# Patient Record
Sex: Female | Born: 1970 | Race: White | Hispanic: No | Smoking: Never smoker
Health system: Southern US, Community
[De-identification: ages and names within clinical notes are randomized; demographics above are authoritative.]

## PROBLEM LIST (undated history)

## (undated) DIAGNOSIS — E282 Polycystic ovarian syndrome: Secondary | ICD-10-CM

## (undated) DIAGNOSIS — G4730 Sleep apnea, unspecified: Secondary | ICD-10-CM

## (undated) DIAGNOSIS — F32A Depression, unspecified: Secondary | ICD-10-CM

## (undated) DIAGNOSIS — T8859XA Other complications of anesthesia, initial encounter: Secondary | ICD-10-CM

## (undated) DIAGNOSIS — N809 Endometriosis, unspecified: Secondary | ICD-10-CM

## (undated) DIAGNOSIS — F419 Anxiety disorder, unspecified: Secondary | ICD-10-CM

## (undated) DIAGNOSIS — J45909 Unspecified asthma, uncomplicated: Secondary | ICD-10-CM

## (undated) DIAGNOSIS — B2790 Infectious mononucleosis, unspecified without complication: Secondary | ICD-10-CM

## (undated) DIAGNOSIS — D649 Anemia, unspecified: Secondary | ICD-10-CM

## (undated) DIAGNOSIS — F99 Mental disorder, not otherwise specified: Secondary | ICD-10-CM

## (undated) HISTORY — PX: CHOLECYSTECTOMY: SHX55

## (undated) HISTORY — DX: Anxiety disorder, unspecified: F41.9

## (undated) HISTORY — PX: ANTERIOR CRUCIATE LIGAMENT REPAIR: SHX115

## (undated) HISTORY — DX: Polycystic ovarian syndrome: E28.2

## (undated) HISTORY — PX: LAPAROSCOPY: SHX197

## (undated) HISTORY — DX: Mental disorder, not otherwise specified: F99

## (undated) HISTORY — DX: Infectious mononucleosis, unspecified without complication: B27.90

---

## 2004-07-02 DIAGNOSIS — K589 Irritable bowel syndrome without diarrhea: Secondary | ICD-10-CM | POA: Insufficient documentation

## 2018-11-09 LAB — HM COLONOSCOPY

## 2020-06-28 ENCOUNTER — Other Ambulatory Visit: Payer: Self-pay

## 2020-06-28 ENCOUNTER — Telehealth: Payer: Self-pay | Admitting: General Practice

## 2020-06-28 ENCOUNTER — Other Ambulatory Visit (HOSPITAL_COMMUNITY)
Admission: RE | Admit: 2020-06-28 | Discharge: 2020-06-28 | Disposition: A | Discharge status: Home / Self Care | Payer: 59 | Source: Ambulatory Visit | Attending: Family Medicine | Admitting: Family Medicine

## 2020-06-28 ENCOUNTER — Ambulatory Visit (INDEPENDENT_AMBULATORY_CARE_PROVIDER_SITE_OTHER): Payer: 59 | Admitting: Family Medicine

## 2020-06-28 ENCOUNTER — Encounter: Payer: Self-pay | Admitting: Family Medicine

## 2020-06-28 VITALS — BP 155/86 | HR 87 | Ht 66.0 in | Wt 262.6 lb

## 2020-06-28 DIAGNOSIS — N809 Endometriosis, unspecified: Secondary | ICD-10-CM

## 2020-06-28 DIAGNOSIS — F3341 Major depressive disorder, recurrent, in partial remission: Secondary | ICD-10-CM

## 2020-06-28 DIAGNOSIS — R030 Elevated blood-pressure reading, without diagnosis of hypertension: Secondary | ICD-10-CM

## 2020-06-28 DIAGNOSIS — G2581 Restless legs syndrome: Secondary | ICD-10-CM | POA: Diagnosis not present

## 2020-06-28 DIAGNOSIS — Z23 Encounter for immunization: Secondary | ICD-10-CM | POA: Diagnosis not present

## 2020-06-28 DIAGNOSIS — N200 Calculus of kidney: Secondary | ICD-10-CM | POA: Insufficient documentation

## 2020-06-28 DIAGNOSIS — Z124 Encounter for screening for malignant neoplasm of cervix: Secondary | ICD-10-CM

## 2020-06-28 DIAGNOSIS — Z01419 Encounter for gynecological examination (general) (routine) without abnormal findings: Secondary | ICD-10-CM | POA: Diagnosis present

## 2020-06-28 DIAGNOSIS — F3342 Major depressive disorder, recurrent, in full remission: Secondary | ICD-10-CM | POA: Insufficient documentation

## 2020-06-28 MED ORDER — BUPROPION HCL ER (XL) 150 MG PO TB24: 300.0000 mg | ORAL_TABLET | Freq: Every day | ORAL | 3 refills | Status: DC

## 2020-06-28 MED ORDER — FLUOXETINE HCL 40 MG PO CAPS: 80.0000 mg | ORAL_CAPSULE | Freq: Every day | ORAL | 3 refills | Status: DC

## 2020-06-28 MED ORDER — ROPINIROLE HCL 0.5 MG PO TABS: 0.5000 mg | ORAL_TABLET | Freq: Every day | ORAL | 3 refills | Status: DC

## 2020-06-28 NOTE — Assessment & Plan Note (Signed)
Given age, rising BP, will trial off OCs. If in menopause and no pain, no further treatment, if gets pain return, then can try Aygestin. A Progestin only. Would not do Orilissa due to depression symptoms.

## 2020-06-28 NOTE — Progress Notes (Signed)
Pt. Wants to discuss BC options. Previously taking Loestrin.  Last Pap: At least 6 years    Rechecked Pt. Blood pressure. First reading: 156/92, Second reading: 155/86. Instructed pt. To establish care with PCP and have this evaluated. Pt. Voiced understanding.

## 2020-06-28 NOTE — Telephone Encounter (Signed)
Called patient and left voicemail for her to callback to get established as a new patient with a provider.

## 2020-06-28 NOTE — Assessment & Plan Note (Signed)
Refilled meds. Usually on mood stabilizer--feels ok--will get psychiatry referral.

## 2020-06-28 NOTE — Patient Instructions (Signed)

## 2020-06-28 NOTE — Progress Notes (Signed)
Subjective:     Maria Clark is a 49 y.o. adult and is here for a comprehensive physical exam. The patient reports problems - on OC's for endometriosis. No cycles last few times. Moved here from [place]. 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 Mom went through menopause at age 30. She wonder if she might be too and is worried about mood if she is. Has been on OC's for a long time. Reports BP is ok at home. 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 Had laparoscopic proven endometriosis with significant painful periods.   The following portions of the patient's history were reviewed and updated as appropriate: allergies, current medications, past family history, past medical history, past social history, past surgical history and problem list.  Review of Systems Pertinent items noted in HPI and remainder of comprehensive ROS otherwise negative.   Objective:    BP (!) 155/86    Pulse 87    Ht 5\' 6"  (1.676 m)    Wt 262 lb 9.6 oz (119.1 kg)    BMI 42.38 kg/m  General appearance: alert, cooperative and appears stated age Head: Normocephalic, without obvious abnormality, atraumatic Neck: no adenopathy, supple, symmetrical, trachea midline and thyroid not enlarged, symmetric, no tenderness/mass/nodules Lungs: clear to auscultation bilaterally Breasts: normal appearance, no masses or tenderness Heart: regular rate and rhythm, S1, S2 normal, no murmur, click, rub or gallop Abdomen: soft, non-tender; bowel sounds normal; no masses,  no organomegaly Pelvic: cervix normal in appearance, external genitalia normal, no adnexal masses or tenderness, no cervical motion tenderness, uterus normal size, shape, and consistency and vagina normal without discharge Extremities: extremities normal, atraumatic, no cyanosis or edema Pulses: 2+ and symmetric Skin: Skin color, texture, turgor normal. No rashes or  lesions Lymph nodes: Cervical, supraclavicular, and axillary nodes normal. Neurologic: Grossly normal    Assessment:    Healthy adult exam.      Plan:   Problem List Items Addressed This Visit      Unprioritized   Endometriosis    Given age, rising BP, will trial off OCs. If in menopause and no pain, no further treatment, if gets pain return, then can try Aygestin. A Progestin only. Would not do Orilissa due to depression symptoms.      Depression    Refilled meds. Usually on mood stabilizer--feels ok--will get psychiatry referral.      Relevant Medications   buPROPion (WELLBUTRIN XL) 150 MG 24 hr tablet   FLUoxetine (PROZAC) 40 MG capsule   Other Relevant Orders   Ambulatory referral to Psychiatry    Other Visit Diagnoses    Restless leg    -  Primary   Relevant Medications   rOPINIRole (REQUIP) 0.5 MG tablet   Screening for malignant neoplasm of cervix       Encounter for gynecological examination without abnormal finding       Labs today   Relevant Orders   Cytology - PAP( [place])   MM DIGITAL SCREENING BILATERAL   Tdap vaccine greater than or equal to 7yo IM (Completed)   CBC   Hemoglobin A1c   Comprehensive metabolic panel   TSH   VITAMIN D 25 Hydroxy (Vit-D Deficiency, Fractures)   Lipid panel   Elevated BP without diagnosis of hypertension       Keep eye on BP at home.     Return in 3 months (on 09/28/2020).    See After  Visit Summary for Counseling Recommendations

## 2020-06-29 LAB — COMPREHENSIVE METABOLIC PANEL
ALT: 28 IU/L (ref 0–32)
AST: 16 IU/L (ref 0–40)
Albumin/Globulin Ratio: 1.4 (ref 1.2–2.2)
Albumin: 4.4 g/dL (ref 3.8–4.8)
Alkaline Phosphatase: 166 IU/L — ABNORMAL HIGH (ref 48–121)
BUN/Creatinine Ratio: 25 — ABNORMAL HIGH (ref 9–23)
BUN: 15 mg/dL (ref 6–24)
Bilirubin Total: 0.2 mg/dL (ref 0.0–1.2)
CO2: 25 mmol/L (ref 20–29)
Calcium: 9.4 mg/dL (ref 8.7–10.2)
Chloride: 98 mmol/L (ref 96–106)
Creatinine, Ser: 0.61 mg/dL (ref 0.57–1.00)
GFR calc Af Amer: 125 mL/min/{1.73_m2} (ref >59)
GFR calc non Af Amer: 108 mL/min/{1.73_m2} (ref >59)
Globulin, Total: 3.2 g/dL (ref 1.5–4.5)
Glucose: 105 mg/dL — ABNORMAL HIGH (ref 65–99)
Potassium: 4 mmol/L (ref 3.5–5.2)
Sodium: 140 mmol/L (ref 134–144)
Total Protein: 7.6 g/dL (ref 6.0–8.5)

## 2020-06-29 LAB — CBC
Hematocrit: 41.1 % (ref 34.0–46.6)
Hemoglobin: 13.7 g/dL (ref 11.1–15.9)
MCH: 29.1 pg (ref 26.6–33.0)
MCHC: 33.3 g/dL (ref 31.5–35.7)
MCV: 87 fL (ref 79–97)
Platelets: 383 10*3/uL (ref 150–450)
RBC: 4.71 x10E6/uL (ref 3.77–5.28)
RDW: 13 % (ref 11.7–15.4)
WBC: 8.5 10*3/uL (ref 3.4–10.8)

## 2020-06-29 LAB — TSH: TSH: 1.45 u[IU]/mL (ref 0.450–4.500)

## 2020-06-29 LAB — VITAMIN D 25 HYDROXY (VIT D DEFICIENCY, FRACTURES): Vit D, 25-Hydroxy: 54.3 ng/mL (ref 30.0–100.0)

## 2020-06-29 LAB — HEMOGLOBIN A1C
Est. average glucose Bld gHb Est-mCnc: 103 mg/dL
Hgb A1c MFr Bld: 5.2 % (ref 4.8–5.6)

## 2020-06-29 LAB — LIPID PANEL
Chol/HDL Ratio: 4.2 ratio (ref 0.0–4.4)
Cholesterol, Total: 162 mg/dL (ref 100–199)
HDL: 39 mg/dL — ABNORMAL LOW (ref >39)
LDL Chol Calc (NIH): 56 mg/dL (ref 0–99)
Triglycerides: 449 mg/dL — ABNORMAL HIGH (ref 0–149)
VLDL Cholesterol Cal: 67 mg/dL — ABNORMAL HIGH (ref 5–40)

## 2020-07-03 ENCOUNTER — Telehealth: Payer: Self-pay | Admitting: Radiology

## 2020-07-03 LAB — CYTOLOGY - PAP
Comment: NEGATIVE
Diagnosis: NEGATIVE
High risk HPV: NEGATIVE

## 2020-07-03 NOTE — Telephone Encounter (Signed)
Called and left voicemail for patient to schedule repeat Lab in 2 months per Dr Kennon Rounds request.

## 2020-07-04 ENCOUNTER — Telehealth: Payer: Self-pay | Admitting: General Practice

## 2020-07-04 NOTE — Telephone Encounter (Signed)
Patient called office and got set up with a new patient appointment with Maria Clark. New patient paperwork has been mailed.

## 2020-08-17 ENCOUNTER — Ambulatory Visit
Admission: RE | Admit: 2020-08-17 | Discharge: 2020-08-17 | Disposition: A | Discharge status: Home / Self Care | Payer: 59 | Source: Ambulatory Visit | Attending: Family Medicine | Admitting: Family Medicine

## 2020-08-17 ENCOUNTER — Telehealth: Payer: Self-pay | Admitting: Radiology

## 2020-08-17 ENCOUNTER — Other Ambulatory Visit: Payer: Self-pay

## 2020-08-17 DIAGNOSIS — Z01419 Encounter for gynecological examination (general) (routine) without abnormal findings: Secondary | ICD-10-CM

## 2020-08-17 NOTE — Telephone Encounter (Signed)
Spoke with patient and explained that I have called [facility] and left a message for them to call [facility] so that I may refer patient. Will follow up tomorrow if they have not called back today. Also gave patient walk-in information for [facility] including address, all this information was also sent to patient via mychart message. Patient expresses understanding and will be awaiting follow up call from myself.

## 2020-08-29 ENCOUNTER — Telehealth: Payer: Self-pay | Admitting: Radiology

## 2020-08-29 NOTE — Telephone Encounter (Signed)
Called patient to follow up regarding referral made to [facility]. Received faxed from [facility] stating that they has left a message for patient to call to schedule appointment. Patient stated that she did get an appointment but not until 11/02/20 @ 9:00. Stated that she was concerned she may run out of meds before appointment. I explained that she can contact the office if she runs out and we can request a refill to be sent for enough medication to get her to appointment date. Patient expressed understand.

## 2020-09-11 ENCOUNTER — Encounter: Payer: Self-pay | Admitting: Family Medicine

## 2020-09-11 ENCOUNTER — Other Ambulatory Visit: Payer: 59

## 2020-09-11 ENCOUNTER — Other Ambulatory Visit: Payer: Self-pay | Admitting: *Deleted

## 2020-09-11 ENCOUNTER — Other Ambulatory Visit: Payer: Self-pay

## 2020-09-11 ENCOUNTER — Ambulatory Visit: Payer: 59 | Admitting: Family Medicine

## 2020-09-11 VITALS — BP 132/90 | HR 72 | Temp 97.6°F | Ht 66.0 in | Wt 270.2 lb

## 2020-09-11 DIAGNOSIS — G2581 Restless legs syndrome: Secondary | ICD-10-CM | POA: Diagnosis not present

## 2020-09-11 DIAGNOSIS — G4733 Obstructive sleep apnea (adult) (pediatric): Secondary | ICD-10-CM

## 2020-09-11 DIAGNOSIS — R519 Headache, unspecified: Secondary | ICD-10-CM

## 2020-09-11 DIAGNOSIS — I10 Essential (primary) hypertension: Secondary | ICD-10-CM

## 2020-09-11 DIAGNOSIS — Z23 Encounter for immunization: Secondary | ICD-10-CM

## 2020-09-11 DIAGNOSIS — J4520 Mild intermittent asthma, uncomplicated: Secondary | ICD-10-CM

## 2020-09-11 DIAGNOSIS — J45909 Unspecified asthma, uncomplicated: Secondary | ICD-10-CM | POA: Insufficient documentation

## 2020-09-11 DIAGNOSIS — F3342 Major depressive disorder, recurrent, in full remission: Secondary | ICD-10-CM

## 2020-09-11 DIAGNOSIS — G8929 Other chronic pain: Secondary | ICD-10-CM

## 2020-09-11 MED ORDER — MONTELUKAST SODIUM 10 MG PO TABS: 10.0000 mg | ORAL_TABLET | Freq: Every day | ORAL | 3 refills | Status: DC

## 2020-09-11 MED ORDER — NORETHINDRONE ACETATE 5 MG PO TABS: 5.0000 mg | ORAL_TABLET | Freq: Every day | ORAL | 10 refills | Status: DC

## 2020-09-11 NOTE — Assessment & Plan Note (Signed)
Etiology - ?HTN vs tension. Will focus on sleep apnea re-evaluation and life style changes. Cont tension treatment approach too.

## 2020-09-11 NOTE — Assessment & Plan Note (Signed)
Diagnosed several years ago and pt with weight gain since then. Advised pulm referral for establishing care here and re-evaluation. Cont using current machine

## 2020-09-11 NOTE — Patient Instructions (Signed)
For Gas avoid: Patients should be advised to avoid gas-producing foods (eg, beans, cabbage, onions, broccoli, brussel sprouts, wheat, and potatoes) and note if symptoms improve.  Heartburn - avoid laying down after you eat - avoid carbonated beverages - avoid spicy foods, coffee, chocolate - Could try Omeprazole 20 mg daily for up to 6-8 weeks   Your blood pressure high.   High blood pressure increases your risk for heart attack and stroke.    Please check your blood pressure 2-4 times a week.   To check your blood pressure 1) Sit in a quiet and relaxed place for 5 minutes 2) Make sure your feet are flat on the ground 3) Consider checking first thing in the morning   Normal blood pressure is less than 140/90 Ideally you blood pressure should be around 120/80  Other ways you can reduce your blood pressure:  1) Regular exercise -- Try to get 150 minutes (30 minutes, 5 days a week) of moderate to vigorous aerobic excercise -- Examples: brisk walking (2.5 miles per hour), water aerobics, dancing, gardening, tennis, biking slower than 10 miles per hour 2) DASH Diet - low fat meats, more fresh fruits and vegetables, whole grains, low salt 3) Quit smoking if you smoke 4) Loose 5-10% of your body weight

## 2020-09-11 NOTE — Assessment & Plan Note (Signed)
Cont ropinirole

## 2020-09-11 NOTE — Assessment & Plan Note (Signed)
BP elevated and reports high readings at home. Advised home monitoring - suspect with weight gain may be related to sleep apnea. Encouraged exercise and diet changes. Return 3 months for bp check

## 2020-09-11 NOTE — Assessment & Plan Note (Signed)
Stable currently. Has psych in November. Reports hx of admissions and mood stabilizers. Agree with psych consult but discussed if stable I could continue wellbutrin and fluoxetine

## 2020-09-11 NOTE — Progress Notes (Signed)
Subjective:     Maria Clark is a 49 y.o. female presenting for Establish Care (needs new bipap machine ), Gas (with bloating ), and Heartburn (x 2 weeks )     HPI  #HA - none in the last 2 weeks but were bad recently - hx of tension HA - adjusted computer at work  #Heartburn - over the last few weeks - gas symptoms - for months - BM - regular, no straining, occ diarrhea - has not found triggers for the gas - started using Charcoal pills for the gas - occ carbonated beverage - has tried gas-x w/o improvement  #HTN - bp occasionally elevated at home w/ headaches - will also get massage for HA - no chest pain - does have some breathing issues   Review of Systems   Social History   Tobacco Use  Smoking Status Never Smoker  Smokeless Tobacco Never Used        Objective:    BP Readings from Last 3 Encounters:  09/11/20 132/90  06/28/20 (!) 155/86   Wt Readings from Last 3 Encounters:  09/11/20 270 lb 4 oz (122.6 kg)  06/28/20 262 lb 9.6 oz (119.1 kg)    BP 132/90    Pulse 72    Temp 97.6 F (36.4 C) (Temporal)    Ht 5\' 6"  (1.676 m)    Wt 270 lb 4 oz (122.6 kg)    LMP 09/10/2020 (Exact Date) Comment: Patient stopped oral BC 1 month ago   SpO2 98%    BMI 43.62 kg/m    Physical Exam Constitutional:      General: She is not in acute distress.    Appearance: She is well-developed. She is obese. She is not diaphoretic.  HENT:     Right Ear: External ear normal.     Left Ear: External ear normal.  Eyes:     Conjunctiva/sclera: Conjunctivae normal.  Cardiovascular:     Rate and Rhythm: Normal rate and regular rhythm.     Heart sounds: No murmur heard.   Pulmonary:     Effort: Pulmonary effort is normal. No respiratory distress.     Breath sounds: Normal breath sounds. No wheezing or rales.  Musculoskeletal:     Cervical back: Neck supple.  Skin:    General: Skin is warm and dry.     Capillary Refill: Capillary refill takes less than 2  seconds.  Neurological:     Mental Status: She is alert. Mental status is at baseline.  Psychiatric:        Mood and Affect: Mood normal.        Behavior: Behavior normal.      The 10-year ASCVD risk score Mikey Bussing DC Jr., et al., 2013) is: 1.2%   Values used to calculate the score:     Age: 53 years     Sex: Female     Is Non-Hispanic African American: No     Diabetic: No     Tobacco smoker: No     Systolic Blood Pressure: 010 mmHg     Is BP treated: No     HDL Cholesterol: 39 mg/dL     Total Cholesterol: 162 mg/dL      Assessment & Plan:   Problem List Items Addressed This Visit      Cardiovascular and Mediastinum   Essential hypertension    BP elevated and reports high readings at home. Advised home monitoring - suspect with weight gain may be related  to sleep apnea. Encouraged exercise and diet changes. Return 3 months for bp check        Respiratory   Asthma - Primary    Stable. Cont singulair and prn albuterol. Notify if allergies and symptoms worsening and can consider qvar again      Relevant Medications   montelukast (SINGULAIR) 10 MG tablet   OSA treated with BiPAP    Diagnosed several years ago and pt with weight gain since then. Advised pulm referral for establishing care here and re-evaluation. Cont using current machine      Relevant Orders   Ambulatory referral to Pulmonology     Other   Major depressive disorder, recurrent episode, in full remission ([place])    Stable currently. Has psych in November. Reports hx of admissions and mood stabilizers. Agree with psych consult but discussed if stable I could continue wellbutrin and fluoxetine      Chronic headaches    Etiology - ?HTN vs tension. Will focus on sleep apnea re-evaluation and life style changes. Cont tension treatment approach too.       Restless legs    Cont ropinirole.        Other Visit Diagnoses    Need for influenza vaccination       Relevant Orders   Flu Vaccine QUAD 36+ mos IM  (Completed)       Return in about 3 months (around 12/11/2020) for BP check.  Lesleigh Noe, MD  This visit occurred during the SARS-CoV-2 public health emergency.  Safety protocols were in place, including screening questions prior to the visit, additional usage of staff PPE, and extensive cleaning of exam room while observing appropriate contact time as indicated for disinfecting solutions.

## 2020-09-11 NOTE — Assessment & Plan Note (Signed)
Stable. Cont singulair and prn albuterol. Notify if allergies and symptoms worsening and can consider qvar again

## 2020-09-13 ENCOUNTER — Other Ambulatory Visit: Payer: 59

## 2020-09-13 ENCOUNTER — Other Ambulatory Visit: Payer: Self-pay

## 2020-09-13 DIAGNOSIS — Z01419 Encounter for gynecological examination (general) (routine) without abnormal findings: Secondary | ICD-10-CM

## 2020-09-14 LAB — COMPREHENSIVE METABOLIC PANEL
ALT: 21 IU/L (ref 0–32)
AST: 15 IU/L (ref 0–40)
Albumin/Globulin Ratio: 1.6 (ref 1.2–2.2)
Albumin: 4.1 g/dL (ref 3.8–4.8)
Alkaline Phosphatase: 133 IU/L — ABNORMAL HIGH (ref 44–121)
BUN/Creatinine Ratio: 17 (ref 9–23)
BUN: 10 mg/dL (ref 6–24)
Bilirubin Total: 0.3 mg/dL (ref 0.0–1.2)
CO2: 25 mmol/L (ref 20–29)
Calcium: 9.3 mg/dL (ref 8.7–10.2)
Chloride: 103 mmol/L (ref 96–106)
Creatinine, Ser: 0.59 mg/dL (ref 0.57–1.00)
GFR calc Af Amer: 126 mL/min/{1.73_m2} (ref >59)
GFR calc non Af Amer: 109 mL/min/{1.73_m2} (ref >59)
Globulin, Total: 2.5 g/dL (ref 1.5–4.5)
Glucose: 90 mg/dL (ref 65–99)
Potassium: 4.7 mmol/L (ref 3.5–5.2)
Sodium: 140 mmol/L (ref 134–144)
Total Protein: 6.6 g/dL (ref 6.0–8.5)

## 2020-09-14 LAB — TRIGLYCERIDES: Triglycerides: 125 mg/dL (ref 0–149)

## 2020-09-26 ENCOUNTER — Encounter: Payer: Self-pay | Admitting: Family Medicine

## 2020-10-08 ENCOUNTER — Emergency Department: Payer: 59

## 2020-10-08 ENCOUNTER — Other Ambulatory Visit: Payer: Self-pay

## 2020-10-08 ENCOUNTER — Emergency Department
Admission: EM | Admit: 2020-10-08 | Discharge: 2020-10-09 | Disposition: A | Discharge status: Home / Self Care | Payer: 59 | Attending: Emergency Medicine | Admitting: Emergency Medicine

## 2020-10-08 DIAGNOSIS — K8020 Calculus of gallbladder without cholecystitis without obstruction: Secondary | ICD-10-CM | POA: Diagnosis not present

## 2020-10-08 DIAGNOSIS — I10 Essential (primary) hypertension: Secondary | ICD-10-CM | POA: Diagnosis not present

## 2020-10-08 DIAGNOSIS — R1011 Right upper quadrant pain: Secondary | ICD-10-CM | POA: Diagnosis present

## 2020-10-08 DIAGNOSIS — R52 Pain, unspecified: Secondary | ICD-10-CM

## 2020-10-08 DIAGNOSIS — J45909 Unspecified asthma, uncomplicated: Secondary | ICD-10-CM | POA: Diagnosis not present

## 2020-10-08 DIAGNOSIS — R197 Diarrhea, unspecified: Secondary | ICD-10-CM | POA: Diagnosis not present

## 2020-10-08 HISTORY — DX: Endometriosis, unspecified: N80.9

## 2020-10-08 LAB — CBC
HCT: 39.2 % (ref 36.0–46.0)
Hemoglobin: 12.8 g/dL (ref 12.0–15.0)
MCH: 29.6 pg (ref 26.0–34.0)
MCHC: 32.7 g/dL (ref 30.0–36.0)
MCV: 90.5 fL (ref 80.0–100.0)
Platelets: 350 10*3/uL (ref 150–400)
RBC: 4.33 MIL/uL (ref 3.87–5.11)
RDW: 13.4 % (ref 11.5–15.5)
WBC: 9.9 10*3/uL (ref 4.0–10.5)
nRBC: 0 % (ref 0.0–0.2)

## 2020-10-08 LAB — COMPREHENSIVE METABOLIC PANEL
ALT: 23 U/L (ref 0–44)
AST: 15 U/L (ref 15–41)
Albumin: 4.2 g/dL (ref 3.5–5.0)
Alkaline Phosphatase: 111 U/L (ref 38–126)
Anion gap: 8 (ref 5–15)
BUN: 15 mg/dL (ref 6–20)
CO2: 27 mmol/L (ref 22–32)
Calcium: 9.4 mg/dL (ref 8.9–10.3)
Chloride: 105 mmol/L (ref 98–111)
Creatinine, Ser: 0.61 mg/dL (ref 0.44–1.00)
GFR, Estimated: >60 mL/min (ref >60)
Glucose, Bld: 126 mg/dL — ABNORMAL HIGH (ref 70–99)
Potassium: 4 mmol/L (ref 3.5–5.1)
Sodium: 140 mmol/L (ref 135–145)
Total Bilirubin: 0.5 mg/dL (ref 0.3–1.2)
Total Protein: 7.7 g/dL (ref 6.5–8.1)

## 2020-10-08 LAB — URINALYSIS, COMPLETE (UACMP) WITH MICROSCOPIC
Bacteria, UA: NONE SEEN
Bilirubin Urine: NEGATIVE
Glucose, UA: NEGATIVE mg/dL
Hgb urine dipstick: NEGATIVE
Ketones, ur: NEGATIVE mg/dL
Leukocytes,Ua: NEGATIVE
Nitrite: NEGATIVE
Protein, ur: NEGATIVE mg/dL
Specific Gravity, Urine: 1.029 (ref 1.005–1.030)
pH: 5 (ref 5.0–8.0)

## 2020-10-08 LAB — LIPASE, BLOOD: Lipase: 36 U/L (ref 11–51)

## 2020-10-08 MED ORDER — ONDANSETRON HCL 4 MG/2ML IJ SOLN
4.0000 mg | Freq: Once | INTRAMUSCULAR | Status: AC
  Administered 2020-10-09: 4 mg via INTRAVENOUS
  Filled 2020-10-08: qty 2

## 2020-10-08 MED ORDER — FENTANYL CITRATE (PF) 100 MCG/2ML IJ SOLN
50.0000 ug | Freq: Once | INTRAMUSCULAR | Status: AC
  Administered 2020-10-09: 50 ug via INTRAVENOUS
  Filled 2020-10-08: qty 2

## 2020-10-08 MED ORDER — KETOROLAC TROMETHAMINE 30 MG/ML IJ SOLN
15.0000 mg | Freq: Once | INTRAMUSCULAR | Status: AC
  Administered 2020-10-09: 15 mg via INTRAVENOUS
  Filled 2020-10-08: qty 1

## 2020-10-08 NOTE — ED Triage Notes (Signed)
PT to ED via POV with complaints of RUQ pain x3 hours, became sever prior to arrival. Known hx of gall stone. PT endorse nausea and diarrhea. NO fevers.

## 2020-10-09 LAB — POC URINE PREG, ED: Preg Test, Ur: NEGATIVE

## 2020-10-09 MED ORDER — ONDANSETRON HCL 4 MG/2ML IJ SOLN
4.0000 mg | Freq: Once | INTRAMUSCULAR | Status: AC
  Administered 2020-10-09: 4 mg via INTRAVENOUS
  Filled 2020-10-09: qty 2

## 2020-10-09 MED ORDER — OXYCODONE HCL 5 MG PO TABS
5.0000 mg | ORAL_TABLET | Freq: Once | ORAL | Status: AC
  Administered 2020-10-09: 5 mg via ORAL
  Filled 2020-10-09: qty 1

## 2020-10-09 MED ORDER — ACETAMINOPHEN 500 MG PO TABS
1000.0000 mg | ORAL_TABLET | Freq: Once | ORAL | Status: AC
  Administered 2020-10-09: 1000 mg via ORAL
  Filled 2020-10-09: qty 2

## 2020-10-09 MED ORDER — ONDANSETRON 4 MG PO TBDP: 4.0000 mg | ORAL_TABLET | Freq: Three times a day (TID) | ORAL | 0 refills | Status: DC | PRN

## 2020-10-09 NOTE — ED Notes (Signed)
PT rates pain 3/10 at this time however pt states if she were to rate her nausea on a scale 1-10, it was a 10 before medications and is a 6 at this time.

## 2020-10-09 NOTE — ED Provider Notes (Signed)
[facility] Emergency Department Provider Note  ____________________________________________  Time seen: Approximately 12:23 AM  I have reviewed the triage vital signs and the nursing notes.   HISTORY  Chief Complaint Abdominal Pain   HPI Maria Clark is a 49 y.o. female with a history of elevated BMI, endometriosis, depression, OSA on BiPAP, hypertension who presents for evaluation of abdominal pain.  Patient reports that the pain started about 5 hours ago after she had dinner.  She reports eating fatty foods.  The pain is sharp, constant, currently 6 out of 10, located in the right upper quadrant and nonradiating.  The pain is associated with pretty significant nausea but no vomiting.  Patient also had a few episodes of watery diarrhea today.  Patient denies any similar symptoms in the past.  Patient has had laparoscopy  for diagnosis of endometriosis in the past but no other abdominal surgeries.  No chest pain or shortness of breath.  Past Medical History:  Diagnosis Date  . Endometriosis   . Mental disorder    Depression    Patient Active Problem List   Diagnosis Date Noted  . Chronic headaches 09/11/2020  . Restless legs 09/11/2020  . Asthma 09/11/2020  . Essential hypertension 09/11/2020  . OSA treated with BiPAP 09/11/2020  . Endometriosis 06/28/2020  . Major depressive disorder, recurrent episode, in full remission ([place]) 06/28/2020  . Nephrolithiasis 06/28/2020    Past Surgical History:  Procedure Laterality Date  . ANTERIOR CRUCIATE LIGAMENT REPAIR     right x 2, left x 1  . LAPAROSCOPY      Prior to Admission medications   Medication Sig Start Date End Date Taking? Authorizing Provider  buPROPion (WELLBUTRIN XL) 150 MG 24 hr tablet Take 2 tablets (300 mg total) by mouth daily. 2 150mg  Daily 06/28/20   Donnamae Jude, MD  FLUoxetine (PROZAC) 40 MG capsule Take 2 capsules (80 mg total) by mouth daily. 2 40mg  Tablets 06/28/20    Donnamae Jude, MD  montelukast (SINGULAIR) 10 MG tablet Take 1 tablet (10 mg total) by mouth at bedtime. 09/11/20   Lesleigh Noe, MD  norethindrone (AYGESTIN) 5 MG tablet Take 1 tablet (5 mg total) by mouth daily. 09/11/20   Donnamae Jude, MD  ondansetron (ZOFRAN ODT) 4 MG disintegrating tablet Take 1 tablet (4 mg total) by mouth every 8 (eight) hours as needed. 10/09/20   Rudene Re, MD  rOPINIRole (REQUIP) 0.5 MG tablet Take 1 tablet (0.5 mg total) by mouth at bedtime. 06/28/20   Donnamae Jude, MD    Allergies Patient has no known allergies.  Family History  Problem Relation Age of Onset  . Hypertension Mother   . Arthritis Mother     Social History Social History   Tobacco Use  . Smoking status: Never Smoker  . Smokeless tobacco: Never Used  Vaping Use  . Vaping Use: Never used  Substance Use Topics  . Alcohol use: Yes    Comment: rarely  . Drug use: Never    Review of Systems  Constitutional: Negative for fever. Eyes: Negative for visual changes. ENT: Negative for sore throat. Neck: No neck pain  Cardiovascular: Negative for chest pain. Respiratory: Negative for shortness of breath. Gastrointestinal: + RUQ abdominal pain and nausea. no vomiting or diarrhea. Genitourinary: Negative for dysuria. Musculoskeletal: Negative for back pain. Skin: Negative for rash. Neurological: Negative for headaches, weakness or numbness. Psych: No SI or HI  ____________________________________________   PHYSICAL EXAM:  VITAL SIGNS: ED Triage Vitals [10/08/20 2222]  Enc Vitals Group     BP (!) 154/96     Pulse Rate 80     Resp 20     Temp 98.1 F (36.7 C)     Temp Source Oral     SpO2 98 %     Weight 270 lb (122.5 kg)     Height 5\' 6"  (1.676 m)     Head Circumference      Peak Flow      Pain Score 8     Pain Loc      Pain Edu?      Excl. in [place]?     Constitutional: Alert and oriented. Well appearing and in no apparent distress. HEENT:      Head:  Normocephalic and atraumatic.         Eyes: Conjunctivae are normal. Sclera is non-icteric.       Mouth/Throat: Mucous membranes are moist.       Neck: Supple with no signs of meningismus. Cardiovascular: Regular rate and rhythm. No murmurs, gallops, or rubs.  Respiratory: Normal respiratory effort. Lungs are clear to auscultation bilaterally.  Gastrointestinal: Soft, tender to palpation on the right upper quadrant and epigastric region with negative Murphy sign, and non distended with positive bowel sounds. No rebound or guarding. Musculoskeletal: No edema, cyanosis, or erythema of extremities. Neurologic: Normal speech and language. Face is symmetric. Moving all extremities. No gross focal neurologic deficits are appreciated. Skin: Skin is warm, dry and intact. No rash noted. Psychiatric: Mood and affect are normal. Speech and behavior are normal.  ____________________________________________   LABS (all labs ordered are listed, but only abnormal results are displayed)  Labs Reviewed  COMPREHENSIVE METABOLIC PANEL - Abnormal; Notable for the following components:      Result Value   Glucose, Bld 126 (*)    All other components within normal limits  URINALYSIS, COMPLETE (UACMP) WITH MICROSCOPIC - Abnormal; Notable for the following components:   Color, Urine YELLOW (*)    APPearance HAZY (*)    All other components within normal limits  LIPASE, BLOOD  CBC  POC URINE PREG, ED   ____________________________________________  EKG  none  ____________________________________________  RADIOLOGY  I have personally reviewed the images performed during this visit and I agree with the Radiologist's read.   Interpretation by Radiologist:  US Abdomen Limited RUQ (LIVER/GB)  Result Date: 10/08/2020 CLINICAL DATA:  Abdominal pain EXAM: ULTRASOUND ABDOMEN LIMITED RIGHT UPPER QUADRANT COMPARISON:  None. FINDINGS: Gallbladder: Multiple layering calcified gallstones are present the  largest measuring 1.8 cm. The gallbladder is partially decompressed with mild wall thickening measuring up to 5 mm. No sonographic Murphy sign. Common bile duct: Diameter: 4 mm. Liver: Increased echotexture seen throughout. No focal abnormality or biliary ductal dilatation. Portal vein is patent on color Doppler imaging with normal direction of blood flow towards the liver. Other: None. IMPRESSION: Cholelithiasis with a mildly thickened gallbladder wall which is nonspecific, however could be due to early mild cholecystitis. Hepatic steatosis Electronically Signed   By: Prudencio Pair M.D.   On: 10/08/2020 23:24     ____________________________________________   PROCEDURES  Procedure(s) performed:yes .1-3 Lead EKG Interpretation Performed by: Rudene Re, MD Authorized by: Rudene Re, MD     Interpretation: non-specific     ECG rate assessment: normal     Rhythm: sinus rhythm     Ectopy: none     Critical Care performed:  None ____________________________________________   INITIAL  IMPRESSION / ASSESSMENT AND PLAN / ED COURSE  49 y.o. female with a history of elevated BMI, endometriosis, depression, OSA on BiPAP, hypertension who presents for evaluation of RUQ abdominal pain and nausea.  Patient has normal vital signs, abdomen is soft and nondistended with right upper quadrant epigastric tenderness and negative Murphy sign.  History gathered from patient and her wife was at bedside.  Old medical records reviewed showing no prior imaging of her abdomen although patient does report having a CT a while back and was told she had gallstones.  Differential diagnosis including symptomatic cholelithiasis versus cholecystitis, less likely ascending cholangitis with no fever versus pancreatitis versus hepatitis versus peptic ulcer disease versus gastritis versus diverticulitis.  Blood work showing no leukocytosis, normal liver panel and lipase.  Mild elevated glucose of 126 which is  nonfasting.  This was discussed with patient recommended outpatient follow-up for fasting blood glucose.  Pregnancy test negative.  UA negative for blood or UTI.  Right upper quadrant ultrasound visualized by me showing several stones in the gallbladder.  Per radiology mildly thickened wall however gallbladder is partially decompressed and therefore this is nonspecific for acute cholecystitis.  Will treat pain with IV fentanyl, Toradol, and Zofran and reassess.  If patient is pain-free and able to tolerate p.o. we will recommend follow-up as an outpatient with surgery for elective cholecystectomy.  If patient remains with pain will consult surgery here for more emergent evaluation.  Plan discussed with patient and her wife.  Patient placed on telemetry for close monitoring.  _________________________ 2:29 AM on 10/09/2020 -----------------------------------------  Pain control switch to oral medication. Patient is currently pain-free. Tolerating p.o. with no recurrence of the pain, nausea or vomiting. Will discharge home with a prescription for Zofran and referral to surgery. Discussed my standard return precautions for recurrence of the pain, fever, nausea or vomiting. Otherwise recommended avoiding sugary and fatty foods until her gallbladder has been removed. Plan discussed with patient and her wife who was at bedside.    _____________________________________________ Please note:  Patient was evaluated in Emergency Department today for the symptoms described in the history of present illness. Patient was evaluated in the context of the global COVID-19 pandemic, which necessitated consideration that the patient might be at risk for infection with the SARS-CoV-2 virus that causes COVID-19. Institutional protocols and algorithms that pertain to the evaluation of patients at risk for COVID-19 are in a state of rapid change based on information released by regulatory bodies including the CDC and federal and  state organizations. These policies and algorithms were followed during the patient's care in the ED.  Some ED evaluations and interventions may be delayed as a result of limited staffing during the pandemic.    Controlled Substance Database was reviewed by me. ____________________________________________   FINAL CLINICAL IMPRESSION(S) / ED DIAGNOSES   Final diagnoses:  Pain  Calculus of gallbladder without cholecystitis without obstruction      NEW MEDICATIONS STARTED DURING THIS VISIT:  ED Discharge Orders         Ordered    ondansetron (ZOFRAN ODT) 4 MG disintegrating tablet  Every 8 hours PRN        10/09/20 0228           Note:  This document was prepared using Dragon voice recognition software and may include unintentional dictation errors.    Rudene Re, MD 10/09/20 [phone]

## 2020-10-09 NOTE — Discharge Instructions (Signed)
Follow-up with surgery within a week for evaluation of possible removal of her gallbladder as an outpatient. If your pain returns, if you have a fever, or if you start vomiting please return to the emergency room as you may need more urgent surgery. Otherwise try to avoid fatty foods and extra sugary foods until you are seen by surgery.

## 2020-10-12 ENCOUNTER — Ambulatory Visit: Payer: Self-pay | Admitting: General Surgery

## 2020-10-12 ENCOUNTER — Other Ambulatory Visit
Admission: RE | Admit: 2020-10-12 | Discharge: 2020-10-12 | Disposition: A | Discharge status: Home / Self Care | Payer: 59 | Source: Ambulatory Visit | Attending: General Surgery | Admitting: General Surgery

## 2020-10-12 ENCOUNTER — Other Ambulatory Visit: Payer: Self-pay

## 2020-10-12 DIAGNOSIS — Z20822 Contact with and (suspected) exposure to covid-19: Secondary | ICD-10-CM | POA: Diagnosis not present

## 2020-10-12 DIAGNOSIS — Z01812 Encounter for preprocedural laboratory examination: Secondary | ICD-10-CM | POA: Insufficient documentation

## 2020-10-12 NOTE — H&P (View-Only) (Signed)
PATIENT PROFILE: Maria Clark is a 49 y.o. female who presents to the Clinic for consultation at the request of Dr. Alfred Levins for evaluation of cholelithiasis.  PCP:  Carrolyn Leigh, MD  HISTORY OF PRESENT ILLNESS: Maria Clark reports she had an episode of abdominal pain on 10/08/2020. She reported the pain was localized to the right upper quadrant. The pain did not radiate to the part of body. Pain was exacerbated by eating fried food. Alleviating factors was multiple doses of pain medication at the ED. Patient has had persistent discomfort and diarrhea since the episode. Denies fever or chills.   PROBLEM LIST: Cholelithasis Anxiety disorders Anemia  GENERAL REVIEW OF SYSTEMS:   General ROS: negative for - chills, fatigue, fever, weight gain or weight loss Allergy and Immunology ROS: negative for - hives  Hematological and Lymphatic ROS: negative for - bleeding problems or bruising, negative for palpable nodes Endocrine ROS: negative for - heat or cold intolerance, hair changes Respiratory ROS: negative for - cough, shortness of breath or wheezing Cardiovascular ROS: no chest pain or palpitations GI ROS: negative for nausea, vomiting, constipation. Positive for abdominal pain and diarrhea Musculoskeletal ROS: negative for - joint swelling or muscle pain Neurological ROS: negative for - confusion, syncope. Positive for headache Dermatological ROS: negative for pruritus and rash Psychiatric: negative for anxiety, depression, difficulty sleeping and memory loss  MEDICATIONS: Current Outpatient Medications  Medication Sig Dispense Refill  . buPROPion (WELLBUTRIN XL) 150 MG XL tablet Take 1 tablet by mouth once daily    . cholecalciferol (VITAMIN D3) 2,000 unit tablet Take 2,000 Units by mouth once daily    . ferrous sulfate 325 (65 FE) MG tablet Take 325 mg by mouth daily with breakfast    . FLUoxetine (PROZAC) 40 MG capsule Take 2 capsules by mouth once daily    .  montelukast (SINGULAIR) 10 mg tablet Take 1 tablet by mouth once daily    . norethindrone (AYGESTIN) 5 mg tablet Take 5 mg by mouth once daily    . ondansetron (ZOFRAN-ODT) 4 MG disintegrating tablet Take 4 mg by mouth every 8 (eight) hours as needed    . vitamin B complex (B COMPLETE ORAL) Take by mouth once daily     No current facility-administered medications for this visit.    ALLERGIES: Opioids - morphine analogues  PAST MEDICAL HISTORY: Past Medical History:  Diagnosis Date  . Asthma, unspecified asthma severity, unspecified whether complicated, unspecified whether persistent   . Depression   . Endometriosis   . Kidney stones   . Sleep apnea    on BiPap    PAST SURGICAL HISTORY: Past Surgical History:  Procedure Laterality Date  . ARTHROSCOPIC REPAIR ACL Left 03/2020   in [place]  . ARTHROSCOPIC REPAIR ACL Right 2010 & 2011   in [place]  . LAPAROSCOPY DIAGNOSTIC     Endometriosis --- around 2004     FAMILY HISTORY: Family History  Problem Relation Age of Onset  . High blood pressure (Hypertension) Mother   . No Known Problems Father   . No Known Problems Brother      SOCIAL HISTORY: Social History   Socioeconomic History  . Marital status: Unknown    Spouse name: Not on file  . Number of children: Not on file  . Years of education: Not on file  . Highest education level: Not on file  Occupational History  . Occupation: mental health therapist  Tobacco Use  . Smoking status: Never Smoker  .  Smokeless tobacco: Never Used  Vaping Use  . Vaping Use: Never used  Substance and Sexual Activity  . Alcohol use: Yes    Comment: very rare  . Drug use: Never  . Sexual activity: Not on file  Other Topics Concern  . Not on file  Social History Narrative  . Not on file   Social Determinants of Health   Financial Resource Strain: Not on file  Food Insecurity: Not on file  Transportation Needs: Not on file    PHYSICAL EXAM: Vitals:   10/12/20 1039  BP: 145/86   Pulse: 84   Body mass index is 43.58 kg/m. Weight: (!) 122.5 kg (270 lb)   GENERAL: Alert, active, oriented x3  HEENT: Pupils equal reactive to light. Extraocular movements are intact. Sclera clear. Palpebral conjunctiva normal red color.Pharynx clear.  NECK: Supple with no palpable mass and no adenopathy.  LUNGS: Sound clear with no rales rhonchi or wheezes.  HEART: Regular rhythm S1 and S2 without murmur.  ABDOMEN: Soft and depressible, nontender with no palpable mass, no hepatomegaly.   EXTREMITIES: Well-developed well-nourished symmetrical with no dependent edema.  NEUROLOGICAL: Awake alert oriented, facial expression symmetrical, moving all extremities.  REVIEW OF DATA: I have reviewed the following data today: No results found for any previous visit.    CBC [facility] at [facility] on 10/08/20 WBC 4.0 - 10.5 K/uL 9.9   RBC 3.87 - 5.11 MIL/uL 4.33   Hemoglobin 12.0 - 15.0 g/dL 12.8   HCT 36 - 46 % 39.2   MCV 80.0 - 100.0 fL 90.5   MCH 26.0 - 34.0 pg 29.6   MCHC 30.0 - 36.0 g/dL 32.7   RDW 11.5 - 15.5 % 13.4   Platelets 150 - 400 K/uL 350   nRBC 0.0 - 0.2 % 0.0    CMP done at [facility] on 10/08/20 Sodium 135 - 145 mmol/L 140   Potassium 3.5 - 5.1 mmol/L 4.0   Chloride 98 - 111 mmol/L 105   CO2 22 - 32 mmol/L 27   Glucose, Bld 70 - 99 mg/dL 126High   BUN 6 - 20 mg/dL 15   Creatinine, Ser 0.44 - 1.00 mg/dL 0.61   Calcium 8.9 - 10.3 mg/dL 9.4   Total Protein 6.5 - 8.1 g/dL 7.7   Albumin 3.5 - 5.0 g/dL 4.2   AST 15 - 41 U/L 15   ALT 0 - 44 U/L 23   Alkaline Phosphatase 38 - 126 U/L 111   Total Bilirubin 0.3 - 1.2 mg/dL 0.5   GFR, Estimated >60 mL/min >60   I personally evaluated the images of the ultrasound done at [facility] on 10/08/20.  Gallstones were identified.  No significant gallbladder thickening.  No pericholecystic fluid.  ASSESSMENT: Maria Clark is a 49 y.o. female presenting for consultation for cholelithiasis.    Patient was oriented about the diagnosis of  cholelithiasis. Also oriented about what is the gallbladder, its anatomy and function and the implications of having stones. The patient was oriented about the treatment alternatives (observation vs cholecystectomy). Patient was oriented that a low percentage of patient will continue to have similar pain symptoms even after the gallbladder is removed. Surgical technique (open vs laparoscopic) was discussed. It was also discussed the goals of the surgery (decrease the pain episodes and avoid the risk of cholecystitis) and the risk of surgery including: bleeding, infection, common bile duct injury, stone retention, injury to other organs such as bowel, liver, stomach, other complications such as hernia, bowel obstruction  among others. Also discussed with patient about anesthesia and its complications such as: reaction to medications, pneumonia, heart complications, death, among others.   Cholelithiasis without cholecystitis [K80.20]  PLAN: 1.  Robotic assisted laparoscopic cholecystectomy (62863) 2.  CBC, CMP (done) 3.  Do not take aspirin 5 days before the procedure 4.  Contact us if has any question or concern.  Patient verbalized understanding, all questions were answered, and were agreeable with the plan outlined above.     Herbert Pun, MD  Electronically signed by Herbert Pun, MD

## 2020-10-12 NOTE — H&P (Signed)
PATIENT PROFILE: Maria Clark is a 49 y.o. female who presents to the Clinic for consultation at the request of Dr. Alfred Levins for evaluation of cholelithiasis.  PCP:  Maria Leigh, MD  HISTORY OF PRESENT ILLNESS: Maria Clark reports she had an episode of abdominal pain on 10/08/2020. She reported the pain was localized to the right upper quadrant. The pain did not radiate to the part of body. Pain was exacerbated by eating fried food. Alleviating factors was multiple doses of pain medication at the ED. Patient has had persistent discomfort and diarrhea since the episode. Denies fever or chills.   PROBLEM LIST: Cholelithasis Anxiety disorders Anemia  GENERAL REVIEW OF SYSTEMS:   General ROS: negative for - chills, fatigue, fever, weight gain or weight loss Allergy and Immunology ROS: negative for - hives  Hematological and Lymphatic ROS: negative for - bleeding problems or bruising, negative for palpable nodes Endocrine ROS: negative for - heat or cold intolerance, hair changes Respiratory ROS: negative for - cough, shortness of breath or wheezing Cardiovascular ROS: no chest pain or palpitations GI ROS: negative for nausea, vomiting, constipation. Positive for abdominal pain and diarrhea Musculoskeletal ROS: negative for - joint swelling or muscle pain Neurological ROS: negative for - confusion, syncope. Positive for headache Dermatological ROS: negative for pruritus and rash Psychiatric: negative for anxiety, depression, difficulty sleeping and memory loss  MEDICATIONS: Current Outpatient Medications  Medication Sig Dispense Refill  . buPROPion (WELLBUTRIN XL) 150 MG XL tablet Take 1 tablet by mouth once daily    . cholecalciferol (VITAMIN D3) 2,000 unit tablet Take 2,000 Units by mouth once daily    . ferrous sulfate 325 (65 FE) MG tablet Take 325 mg by mouth daily with breakfast    . FLUoxetine (PROZAC) 40 MG capsule Take 2 capsules by mouth once daily    .  montelukast (SINGULAIR) 10 mg tablet Take 1 tablet by mouth once daily    . norethindrone (AYGESTIN) 5 mg tablet Take 5 mg by mouth once daily    . ondansetron (ZOFRAN-ODT) 4 MG disintegrating tablet Take 4 mg by mouth every 8 (eight) hours as needed    . vitamin B complex (B COMPLETE ORAL) Take by mouth once daily     No current facility-administered medications for this visit.    ALLERGIES: Opioids - morphine analogues  PAST MEDICAL HISTORY: Past Medical History:  Diagnosis Date  . Asthma, unspecified asthma severity, unspecified whether complicated, unspecified whether persistent   . Depression   . Endometriosis   . Kidney stones   . Sleep apnea    on BiPap    PAST SURGICAL HISTORY: Past Surgical History:  Procedure Laterality Date  . ARTHROSCOPIC REPAIR ACL Left 03/2020   in [place]  . ARTHROSCOPIC REPAIR ACL Right 2010 & 2011   in [place]  . LAPAROSCOPY DIAGNOSTIC     Endometriosis --- around 2004     FAMILY HISTORY: Family History  Problem Relation Age of Onset  . High blood pressure (Hypertension) Mother   . No Known Problems Father   . No Known Problems Brother      SOCIAL HISTORY: Social History   Socioeconomic History  . Marital status: Unknown    Spouse name: Not on file  . Number of children: Not on file  . Years of education: Not on file  . Highest education level: Not on file  Occupational History  . Occupation: mental health therapist  Tobacco Use  . Smoking status: Never Smoker  .  Smokeless tobacco: Never Used  Vaping Use  . Vaping Use: Never used  Substance and Sexual Activity  . Alcohol use: Yes    Comment: very rare  . Drug use: Never  . Sexual activity: Not on file  Other Topics Concern  . Not on file  Social History Narrative  . Not on file   Social Determinants of Health   Financial Resource Strain: Not on file  Food Insecurity: Not on file  Transportation Needs: Not on file    PHYSICAL EXAM: Vitals:   10/12/20 1039  BP: 145/86   Pulse: 84   Body mass index is 43.58 kg/m. Weight: (!) 122.5 kg (270 lb)   GENERAL: Alert, active, oriented x3  HEENT: Pupils equal reactive to light. Extraocular movements are intact. Sclera clear. Palpebral conjunctiva normal red color.Pharynx clear.  NECK: Supple with no palpable mass and no adenopathy.  LUNGS: Sound clear with no rales rhonchi or wheezes.  HEART: Regular rhythm S1 and S2 without murmur.  ABDOMEN: Soft and depressible, nontender with no palpable mass, no hepatomegaly.   EXTREMITIES: Well-developed well-nourished symmetrical with no dependent edema.  NEUROLOGICAL: Awake alert oriented, facial expression symmetrical, moving all extremities.  REVIEW OF DATA: I have reviewed the following data today: No results found for any previous visit.    CBC done at [facility] on 10/08/20 WBC 4.0 - 10.5 K/uL 9.9   RBC 3.87 - 5.11 MIL/uL 4.33   Hemoglobin 12.0 - 15.0 g/dL 12.8   HCT 36 - 46 % 39.2   MCV 80.0 - 100.0 fL 90.5   MCH 26.0 - 34.0 pg 29.6   MCHC 30.0 - 36.0 g/dL 32.7   RDW 11.5 - 15.5 % 13.4   Platelets 150 - 400 K/uL 350   nRBC 0.0 - 0.2 % 0.0    CMP done at [facility] on 10/08/20 Sodium 135 - 145 mmol/L 140   Potassium 3.5 - 5.1 mmol/L 4.0   Chloride 98 - 111 mmol/L 105   CO2 22 - 32 mmol/L 27   Glucose, Bld 70 - 99 mg/dL 126High   BUN 6 - 20 mg/dL 15   Creatinine, Ser 0.44 - 1.00 mg/dL 0.61   Calcium 8.9 - 10.3 mg/dL 9.4   Total Protein 6.5 - 8.1 g/dL 7.7   Albumin 3.5 - 5.0 g/dL 4.2   AST 15 - 41 U/L 15   ALT 0 - 44 U/L 23   Alkaline Phosphatase 38 - 126 U/L 111   Total Bilirubin 0.3 - 1.2 mg/dL 0.5   GFR, Estimated >60 mL/min >60   I personally evaluated the images of the ultrasound done at [facility] on 10/08/20.  Gallstones were identified.  No significant gallbladder thickening.  No pericholecystic fluid.  ASSESSMENT: [name] is a 49 y.o. female presenting for consultation for cholelithiasis.    Patient was oriented about the diagnosis of  cholelithiasis. Also oriented about what is the gallbladder, its anatomy and function and the implications of having stones. The patient was oriented about the treatment alternatives (observation vs cholecystectomy). Patient was oriented that a low percentage of patient will continue to have similar pain symptoms even after the gallbladder is removed. Surgical technique (open vs laparoscopic) was discussed. It was also discussed the goals of the surgery (decrease the pain episodes and avoid the risk of cholecystitis) and the risk of surgery including: bleeding, infection, common bile duct injury, stone retention, injury to other organs such as bowel, liver, stomach, other complications such as hernia, bowel obstruction  among others. Also discussed with patient about anesthesia and its complications such as: reaction to medications, pneumonia, heart complications, death, among others.   Cholelithiasis without cholecystitis [K80.20]  PLAN: 1.  Robotic assisted laparoscopic cholecystectomy (69450) 2.  CBC, CMP (done) 3.  Do not take aspirin 5 days before the procedure 4.  Contact us if has any question or concern.  Patient verbalized understanding, all questions were answered, and were agreeable with the plan outlined above.     Herbert Pun, MD  Electronically signed by Herbert Pun, MD

## 2020-10-13 ENCOUNTER — Encounter
Admission: RE | Admit: 2020-10-13 | Discharge: 2020-10-13 | Disposition: A | Discharge status: Home / Self Care | Payer: 59 | Source: Ambulatory Visit | Attending: General Surgery | Admitting: General Surgery

## 2020-10-13 ENCOUNTER — Other Ambulatory Visit: Payer: 59

## 2020-10-13 HISTORY — DX: Other complications of anesthesia, initial encounter: T88.59XA

## 2020-10-13 HISTORY — DX: Sleep apnea, unspecified: G47.30

## 2020-10-13 HISTORY — DX: Depression, unspecified: F32.A

## 2020-10-13 HISTORY — DX: Anemia, unspecified: D64.9

## 2020-10-13 HISTORY — DX: Unspecified asthma, uncomplicated: J45.909

## 2020-10-13 LAB — SARS CORONAVIRUS 2 (TAT 6-24 HRS): SARS Coronavirus 2: NEGATIVE

## 2020-10-13 NOTE — Patient Instructions (Signed)
Your procedure is scheduled on: 10/16/20 Report to [place]. 6 am arrival To find out your arrival time please call [phone] between 1PM - 3PM on 10/13/20 (done).  Remember: Instructions that are not followed completely may result in serious medical risk, up to and including death, or upon the discretion of your surgeon and anesthesiologist your surgery may need to be rescheduled.     _X__ 1. Do not eat food after midnight the night before your procedure.                 No gum chewing or hard candies. You may drink clear liquids up to 2 hours                 before you are scheduled to arrive for your surgery- DO not drink clear                 liquids within 2 hours of the start of your surgery.                 Clear Liquids include:  water, apple juice without pulp, clear carbohydrate                 drink such as Clearfast or Gatorade, Black Coffee or Tea (Do not add                 anything to coffee or tea). Diabetics water only  __X__2.  On the morning of surgery brush your teeth with toothpaste and water, you                 may rinse your mouth with mouthwash if you wish.  Do not swallow any              toothpaste of mouthwash.     _X__ 3.  No Alcohol for 24 hours before or after surgery.   _X__ 4.  Do Not Smoke or use e-cigarettes For 24 Hours Prior to Your Surgery.                 Do not use any chewable tobacco products for at least 6 hours prior to                 surgery.  ____  5.  Bring all medications with you on the day of surgery if instructed.   __X__  6.  Notify your doctor if there is any change in your medical condition      (cold, fever, infections).     Do not wear jewelry, make-up, hairpins, clips or nail polish. Do not wear lotions, powders, or perfumes.  Do not shave 48 hours prior to surgery. Men may shave face and neck. Do not bring valuables to the hospital.    [facility] is not responsible  for any belongings or valuables.  Contacts, dentures/partials or body piercings may not be worn into surgery. Bring a case for your contacts, glasses or hearing aids, a denture cup will be supplied. Leave your suitcase in the car. After surgery it may be brought to your room. For patients admitted to the hospital, discharge time is determined by your treatment team.   Patients discharged the day of surgery will not be allowed to drive home.   Please read over the following fact sheets that you were given:   MRSA Information  __X__ Take these medicines the morning of surgery with A SIP  OF WATER:    1. buPROPion (WELLBUTRIN XL) 300 MG 24 hr tablet  2. FLUoxetine (PROZAC) 80 MG capsule  3.   4.  5.  6.  ____ Fleet Enema (as directed)   ____ Use CHG Soap/SAGE wipes as directed  __X__ Use inhalers on the day of surgery  ____ Stop metformin/Janumet/Farxiga 2 days prior to surgery    ____ Take 1/2 of usual insulin dose the night before surgery. No insulin the morning          of surgery.   ____ Stop Blood Thinners Coumadin/Plavix/Xarelto/Pleta/Pradaxa/Eliquis/Effient/Aspirin  on   Or contact your Surgeon, Cardiologist or Medical Doctor regarding  ability to stop your blood thinners  __X__ Stop Anti-inflammatories 7 days before surgery such as Advil, Ibuprofen, Motrin,  BC or Goodies Powder, Naprosyn, Naproxen, Aleve, Aspirin    __X__ Stop all herbal supplements, fish oil or vitamin E until after surgery.    ____ Bring C-Pap to the hospital.

## 2020-10-15 MED ORDER — INDOCYANINE GREEN 25 MG IV SOLR
7.5000 mg | Freq: Once | INTRAVENOUS | Status: AC
  Administered 2020-10-16: 7.5 mg via INTRAVENOUS
  Filled 2020-10-15: qty 3
  Filled 2020-10-15: qty 10

## 2020-10-16 ENCOUNTER — Ambulatory Visit: Payer: 59 | Admitting: Anesthesiology

## 2020-10-16 ENCOUNTER — Other Ambulatory Visit: Payer: Self-pay

## 2020-10-16 ENCOUNTER — Encounter: Payer: Self-pay | Admitting: General Surgery

## 2020-10-16 ENCOUNTER — Ambulatory Visit
Admission: RE | Admit: 2020-10-16 | Discharge: 2020-10-16 | Disposition: A | Discharge status: Home / Self Care | Payer: 59 | Attending: General Surgery | Admitting: General Surgery

## 2020-10-16 ENCOUNTER — Encounter
Admission: RE | Disposition: A | Discharge status: Home / Self Care | Payer: Self-pay | Source: Home / Self Care | Attending: General Surgery

## 2020-10-16 DIAGNOSIS — F419 Anxiety disorder, unspecified: Secondary | ICD-10-CM | POA: Diagnosis not present

## 2020-10-16 DIAGNOSIS — K8010 Calculus of gallbladder with chronic cholecystitis without obstruction: Secondary | ICD-10-CM | POA: Insufficient documentation

## 2020-10-16 DIAGNOSIS — Z79899 Other long term (current) drug therapy: Secondary | ICD-10-CM | POA: Diagnosis not present

## 2020-10-16 DIAGNOSIS — Z8249 Family history of ischemic heart disease and other diseases of the circulatory system: Secondary | ICD-10-CM | POA: Insufficient documentation

## 2020-10-16 DIAGNOSIS — Z885 Allergy status to narcotic agent status: Secondary | ICD-10-CM | POA: Insufficient documentation

## 2020-10-16 DIAGNOSIS — K828 Other specified diseases of gallbladder: Secondary | ICD-10-CM | POA: Diagnosis not present

## 2020-10-16 DIAGNOSIS — Z793 Long term (current) use of hormonal contraceptives: Secondary | ICD-10-CM | POA: Diagnosis not present

## 2020-10-16 DIAGNOSIS — Z87442 Personal history of urinary calculi: Secondary | ICD-10-CM | POA: Insufficient documentation

## 2020-10-16 DIAGNOSIS — J45909 Unspecified asthma, uncomplicated: Secondary | ICD-10-CM | POA: Insufficient documentation

## 2020-10-16 DIAGNOSIS — D649 Anemia, unspecified: Secondary | ICD-10-CM | POA: Diagnosis not present

## 2020-10-16 LAB — POCT PREGNANCY, URINE: Preg Test, Ur: NEGATIVE

## 2020-10-16 SURGERY — CHOLECYSTECTOMY, ROBOT-ASSISTED, LAPAROSCOPIC
Anesthesia: General | Site: Abdomen

## 2020-10-16 MED ORDER — ORAL CARE MOUTH RINSE: 15.0000 mL | Freq: Once | OROMUCOSAL | Status: AC

## 2020-10-16 MED ORDER — DEXAMETHASONE SODIUM PHOSPHATE 10 MG/ML IJ SOLN
INTRAMUSCULAR | Status: AC
  Filled 2020-10-16: qty 1

## 2020-10-16 MED ORDER — BUPIVACAINE-EPINEPHRINE 0.25% -1:200000 IJ SOLN
INTRAMUSCULAR | Status: DC | PRN
  Administered 2020-10-16: 25 mL

## 2020-10-16 MED ORDER — ROCURONIUM BROMIDE 10 MG/ML (PF) SYRINGE
PREFILLED_SYRINGE | INTRAVENOUS | Status: AC
  Filled 2020-10-16: qty 10

## 2020-10-16 MED ORDER — LIDOCAINE HCL (CARDIAC) PF 100 MG/5ML IV SOSY
PREFILLED_SYRINGE | INTRAVENOUS | Status: DC | PRN
  Administered 2020-10-16: 60 mg via INTRAVENOUS
  Administered 2020-10-16: 40 mg via INTRAVENOUS

## 2020-10-16 MED ORDER — BUPIVACAINE-EPINEPHRINE (PF) 0.25% -1:200000 IJ SOLN
INTRAMUSCULAR | Status: AC
  Filled 2020-10-16: qty 30

## 2020-10-16 MED ORDER — FENTANYL CITRATE (PF) 100 MCG/2ML IJ SOLN
INTRAMUSCULAR | Status: AC
  Filled 2020-10-16: qty 2

## 2020-10-16 MED ORDER — DEXMEDETOMIDINE (PRECEDEX) IN NS 20 MCG/5ML (4 MCG/ML) IV SYRINGE
PREFILLED_SYRINGE | INTRAVENOUS | Status: DC | PRN
  Administered 2020-10-16 (×2): 8 ug via INTRAVENOUS
  Administered 2020-10-16: 4 ug via INTRAVENOUS

## 2020-10-16 MED ORDER — SODIUM CHLORIDE 0.9 % IV SOLN
INTRAVENOUS | Status: DC | PRN
  Administered 2020-10-16: 30 ug/min via INTRAVENOUS

## 2020-10-16 MED ORDER — FAMOTIDINE 20 MG PO TABS
ORAL_TABLET | ORAL | Status: AC
  Administered 2020-10-16: 20 mg via ORAL
  Filled 2020-10-16: qty 1

## 2020-10-16 MED ORDER — PHENYLEPHRINE HCL (PRESSORS) 10 MG/ML IV SOLN
INTRAVENOUS | Status: AC
  Filled 2020-10-16: qty 1

## 2020-10-16 MED ORDER — DEXMEDETOMIDINE (PRECEDEX) IN NS 20 MCG/5ML (4 MCG/ML) IV SYRINGE
PREFILLED_SYRINGE | INTRAVENOUS | Status: AC
  Filled 2020-10-16: qty 5

## 2020-10-16 MED ORDER — HYDROCODONE-ACETAMINOPHEN 5-325 MG PO TABS: 1.0000 | ORAL_TABLET | ORAL | 0 refills | Status: AC | PRN

## 2020-10-16 MED ORDER — DROPERIDOL 2.5 MG/ML IJ SOLN
0.6250 mg | Freq: Once | INTRAMUSCULAR | Status: DC | PRN
  Filled 2020-10-16: qty 2

## 2020-10-16 MED ORDER — PROPOFOL 500 MG/50ML IV EMUL
INTRAVENOUS | Status: DC | PRN
  Administered 2020-10-16: 50 ug/kg/min via INTRAVENOUS

## 2020-10-16 MED ORDER — LACTATED RINGERS IV SOLN: INTRAVENOUS | Status: DC

## 2020-10-16 MED ORDER — PROPOFOL 10 MG/ML IV BOLUS
INTRAVENOUS | Status: AC
  Filled 2020-10-16: qty 20

## 2020-10-16 MED ORDER — LIDOCAINE HCL (PF) 2 % IJ SOLN
INTRAMUSCULAR | Status: AC
  Filled 2020-10-16: qty 5

## 2020-10-16 MED ORDER — CEFAZOLIN SODIUM-DEXTROSE 2-4 GM/100ML-% IV SOLN
INTRAVENOUS | Status: AC
  Filled 2020-10-16: qty 100

## 2020-10-16 MED ORDER — FENTANYL CITRATE (PF) 100 MCG/2ML IJ SOLN
INTRAMUSCULAR | Status: DC | PRN
  Administered 2020-10-16 (×3): 50 ug via INTRAVENOUS

## 2020-10-16 MED ORDER — ACETAMINOPHEN 10 MG/ML IV SOLN
INTRAVENOUS | Status: DC | PRN
  Administered 2020-10-16: 1000 mg via INTRAVENOUS

## 2020-10-16 MED ORDER — MIDAZOLAM HCL 2 MG/2ML IJ SOLN
INTRAMUSCULAR | Status: AC
  Filled 2020-10-16: qty 2

## 2020-10-16 MED ORDER — CHLORHEXIDINE GLUCONATE 0.12 % MT SOLN
OROMUCOSAL | Status: AC
  Administered 2020-10-16: 15 mL via OROMUCOSAL
  Filled 2020-10-16: qty 15

## 2020-10-16 MED ORDER — ONDANSETRON HCL 4 MG/2ML IJ SOLN
INTRAMUSCULAR | Status: AC
  Filled 2020-10-16: qty 2

## 2020-10-16 MED ORDER — FENTANYL CITRATE (PF) 100 MCG/2ML IJ SOLN
INTRAMUSCULAR | Status: AC
Start: 2020-10-16 — End: ?
  Filled 2020-10-16: qty 2

## 2020-10-16 MED ORDER — SUGAMMADEX SODIUM 500 MG/5ML IV SOLN
INTRAVENOUS | Status: AC
  Filled 2020-10-16: qty 5

## 2020-10-16 MED ORDER — ACETAMINOPHEN 325 MG PO TABS: 325.0000 mg | ORAL_TABLET | ORAL | Status: DC | PRN

## 2020-10-16 MED ORDER — HYDROCODONE-ACETAMINOPHEN 7.5-325 MG PO TABS
1.0000 | ORAL_TABLET | Freq: Once | ORAL | Status: AC | PRN
  Administered 2020-10-16: 1 via ORAL
  Filled 2020-10-16: qty 1

## 2020-10-16 MED ORDER — FAMOTIDINE 20 MG PO TABS: 20.0000 mg | ORAL_TABLET | Freq: Once | ORAL | Status: AC

## 2020-10-16 MED ORDER — MEPERIDINE HCL 50 MG/ML IJ SOLN: 6.2500 mg | INTRAMUSCULAR | Status: DC | PRN

## 2020-10-16 MED ORDER — PHENYLEPHRINE HCL (PRESSORS) 10 MG/ML IV SOLN
INTRAVENOUS | Status: DC | PRN
  Administered 2020-10-16 (×3): 100 ug via INTRAVENOUS
  Administered 2020-10-16: 200 ug via INTRAVENOUS

## 2020-10-16 MED ORDER — CEFAZOLIN SODIUM-DEXTROSE 2-4 GM/100ML-% IV SOLN
2.0000 g | INTRAVENOUS | Status: AC
  Administered 2020-10-16: 2 g via INTRAVENOUS

## 2020-10-16 MED ORDER — KETOROLAC TROMETHAMINE 30 MG/ML IJ SOLN
INTRAMUSCULAR | Status: AC
  Filled 2020-10-16: qty 1

## 2020-10-16 MED ORDER — PROPOFOL 10 MG/ML IV BOLUS
INTRAVENOUS | Status: DC | PRN
  Administered 2020-10-16: 150 mg via INTRAVENOUS
  Administered 2020-10-16: 50 mg via INTRAVENOUS

## 2020-10-16 MED ORDER — ACETAMINOPHEN 160 MG/5ML PO SOLN
325.0000 mg | ORAL | Status: DC | PRN
  Filled 2020-10-16: qty 20.3

## 2020-10-16 MED ORDER — SUGAMMADEX SODIUM 500 MG/5ML IV SOLN
INTRAVENOUS | Status: DC | PRN
  Administered 2020-10-16: 250 mg via INTRAVENOUS

## 2020-10-16 MED ORDER — KETOROLAC TROMETHAMINE 30 MG/ML IJ SOLN
30.0000 mg | Freq: Once | INTRAMUSCULAR | Status: AC | PRN
  Administered 2020-10-16: 30 mg via INTRAVENOUS

## 2020-10-16 MED ORDER — ACETAMINOPHEN 10 MG/ML IV SOLN
INTRAVENOUS | Status: AC
  Filled 2020-10-16: qty 100

## 2020-10-16 MED ORDER — CHLORHEXIDINE GLUCONATE 0.12 % MT SOLN: 15.0000 mL | Freq: Once | OROMUCOSAL | Status: AC

## 2020-10-16 MED ORDER — ROCURONIUM BROMIDE 100 MG/10ML IV SOLN
INTRAVENOUS | Status: DC | PRN
  Administered 2020-10-16: 55 mg via INTRAVENOUS
  Administered 2020-10-16: 10 mg via INTRAVENOUS
  Administered 2020-10-16: 5 mg via INTRAVENOUS

## 2020-10-16 MED ORDER — PROMETHAZINE HCL 25 MG/ML IJ SOLN: 6.2500 mg | INTRAMUSCULAR | Status: DC | PRN

## 2020-10-16 MED ORDER — HYDROCODONE-ACETAMINOPHEN 7.5-325 MG PO TABS
ORAL_TABLET | ORAL | Status: AC
  Filled 2020-10-16: qty 1

## 2020-10-16 MED ORDER — DEXAMETHASONE SODIUM PHOSPHATE 10 MG/ML IJ SOLN
INTRAMUSCULAR | Status: DC | PRN
  Administered 2020-10-16: 10 mg via INTRAVENOUS

## 2020-10-16 MED ORDER — SUCCINYLCHOLINE CHLORIDE 20 MG/ML IJ SOLN
INTRAMUSCULAR | Status: DC | PRN
  Administered 2020-10-16: 120 mg via INTRAVENOUS

## 2020-10-16 MED ORDER — ONDANSETRON HCL 4 MG/2ML IJ SOLN
INTRAMUSCULAR | Status: DC | PRN
  Administered 2020-10-16: 4 mg via INTRAVENOUS

## 2020-10-16 MED ORDER — FENTANYL CITRATE (PF) 100 MCG/2ML IJ SOLN
25.0000 ug | INTRAMUSCULAR | Status: DC | PRN
  Administered 2020-10-16 (×4): 50 ug via INTRAVENOUS

## 2020-10-16 SURGICAL SUPPLY — 56 items
ADH SKN CLS APL DERMABOND .7 (GAUZE/BANDAGES/DRESSINGS) ×2
APL PRP STRL LF DISP 70% ISPRP (MISCELLANEOUS) ×2
BAG INFUSER PRESSURE 100CC (MISCELLANEOUS) IMPLANT
BAG SPEC RTRVL LRG 6X4 10 (ENDOMECHANICALS) ×2
BLADE SURG SZ11 CARB STEEL (BLADE) ×4 IMPLANT
CANISTER SUCT 1200ML W/VALVE (MISCELLANEOUS) ×4 IMPLANT
CANNULA REDUC XI 12-8 STAPL (CANNULA) ×1
CANNULA REDUC XI 12-8MM STAPL (CANNULA) ×1
CANNULA REDUCER 12-8 DVNC XI (CANNULA) ×2 IMPLANT
CHLORAPREP W/TINT 26 (MISCELLANEOUS) ×4 IMPLANT
CLIP VESOLOCK MED LG 6/CT (CLIP) ×4 IMPLANT
COVER WAND RF STERILE (DRAPES) ×4 IMPLANT
DECANTER SPIKE VIAL GLASS SM (MISCELLANEOUS) ×8 IMPLANT
DEFOGGER SCOPE WARMER CLEARIFY (MISCELLANEOUS) ×4 IMPLANT
DERMABOND ADVANCED (GAUZE/BANDAGES/DRESSINGS) ×2
DERMABOND ADVANCED .7 DNX12 (GAUZE/BANDAGES/DRESSINGS) ×2 IMPLANT
DRAPE ARM DVNC X/XI (DISPOSABLE) ×8 IMPLANT
DRAPE COLUMN DVNC XI (DISPOSABLE) ×2 IMPLANT
DRAPE DA VINCI XI ARM (DISPOSABLE) ×8
DRAPE DA VINCI XI COLUMN (DISPOSABLE) ×2
ELECT REM PT RETURN 9FT ADLT (ELECTROSURGICAL) ×4
ELECTRODE REM PT RTRN 9FT ADLT (ELECTROSURGICAL) ×2 IMPLANT
GLOVE BIO SURGEON STRL SZ 6.5 (GLOVE) ×6 IMPLANT
GLOVE BIO SURGEONS STRL SZ 6.5 (GLOVE) ×2
GLOVE BIOGEL PI IND STRL 6.5 (GLOVE) ×4 IMPLANT
GLOVE BIOGEL PI INDICATOR 6.5 (GLOVE) ×4
GOWN STRL REUS W/ TWL LRG LVL3 (GOWN DISPOSABLE) ×6 IMPLANT
GOWN STRL REUS W/TWL LRG LVL3 (GOWN DISPOSABLE) ×12
GRASPER SUT TROCAR 14GX15 (MISCELLANEOUS) ×4 IMPLANT
IRRIGATOR SUCT 8 DISP DVNC XI (IRRIGATION / IRRIGATOR) IMPLANT
IRRIGATOR SUCTION 8MM XI DISP (IRRIGATION / IRRIGATOR)
IV NS 1000ML (IV SOLUTION)
IV NS 1000ML BAXH (IV SOLUTION) IMPLANT
KIT PINK PAD W/HEAD ARE REST (MISCELLANEOUS) ×4
KIT PINK PAD W/HEAD ARM REST (MISCELLANEOUS) ×2 IMPLANT
LABEL OR SOLS (LABEL) ×4 IMPLANT
NEEDLE HYPO 22GX1.5 SAFETY (NEEDLE) ×4 IMPLANT
NEEDLE INSUFFLATION 14GA 120MM (NEEDLE) ×4 IMPLANT
NS IRRIG 500ML POUR BTL (IV SOLUTION) ×4 IMPLANT
OBTURATOR OPTICAL STANDARD 8MM (TROCAR) ×2
OBTURATOR OPTICAL STND 8 DVNC (TROCAR) ×2
OBTURATOR OPTICALSTD 8 DVNC (TROCAR) ×2 IMPLANT
PACK LAP CHOLECYSTECTOMY (MISCELLANEOUS) ×4 IMPLANT
POUCH SPECIMEN RETRIEVAL 10MM (ENDOMECHANICALS) ×4 IMPLANT
SEAL CANN UNIV 5-8 DVNC XI (MISCELLANEOUS) ×6 IMPLANT
SEAL XI 5MM-8MM UNIVERSAL (MISCELLANEOUS) ×6
SET TUBE SMOKE EVAC HIGH FLOW (TUBING) ×4 IMPLANT
SOLUTION ELECTROLUBE (MISCELLANEOUS) ×4 IMPLANT
STAPLER CANNULA SEAL DVNC XI (STAPLE) ×2 IMPLANT
STAPLER CANNULA SEAL XI (STAPLE) ×2
SUT MNCRL 4-0 (SUTURE) ×12
SUT MNCRL 4-0 27XMFL (SUTURE) ×6
SUT VIC AB 3-0 SH 27 (SUTURE)
SUT VIC AB 3-0 SH 27X BRD (SUTURE) IMPLANT
SUT VICRYL 0 AB UR-6 (SUTURE) ×4 IMPLANT
SUTURE MNCRL 4-0 27XMF (SUTURE) ×6 IMPLANT

## 2020-10-16 NOTE — Anesthesia Postprocedure Evaluation (Signed)
Anesthesia Post Note  Patient: Maria Clark  Procedure(s) Performed: XI ROBOTIC ASSISTED LAPAROSCOPIC CHOLECYSTECTOMY (N/A Abdomen) INDOCYANINE GREEN FLUORESCENCE IMAGING (ICG)  Patient location during evaluation: PACU Anesthesia Type: General Level of consciousness: awake and alert Pain management: pain level controlled Vital Signs Assessment: post-procedure vital signs reviewed and stable Respiratory status: spontaneous breathing, nonlabored ventilation and respiratory function stable Cardiovascular status: blood pressure returned to baseline and stable Postop Assessment: no apparent nausea or vomiting Anesthetic complications: no   No complications documented.   Last Vitals:  Vitals:   10/16/20 1105 10/16/20 1114  BP: 116/69 (!) 106/59  Pulse: 70 75  Resp: 11 16  Temp: 36.5 C (!) 36.2 C  SpO2: 95% 97%    Last Pain:  Vitals:   10/16/20 1149  TempSrc:   PainSc: 4                  Aylah Yeary Harvie Heck

## 2020-10-16 NOTE — Anesthesia Procedure Notes (Signed)
Procedure Name: Intubation Date/Time: 10/16/2020 7:48 AM Performed by: Jonna Clark, CRNA Pre-anesthesia Checklist: Patient identified, Patient being monitored, Timeout performed, Emergency Drugs available and Suction available Patient Re-evaluated:Patient Re-evaluated prior to induction Oxygen Delivery Method: Circle system utilized Preoxygenation: Pre-oxygenation with 100% oxygen Induction Type: IV induction Ventilation: Mask ventilation without difficulty Laryngoscope Size: 3 and McGraph Grade View: Grade I Tube type: Oral Tube size: 7.0 mm Number of attempts: 1 Airway Equipment and Method: Stylet Placement Confirmation: ETT inserted through vocal cords under direct vision,  positive ETCO2 and breath sounds checked- equal and bilateral Secured at: 22 cm Tube secured with: Tape Dental Injury: Teeth and Oropharynx as per pre-operative assessment

## 2020-10-16 NOTE — Interval H&P Note (Signed)
History and Physical Interval Note:  10/16/2020 6:51 AM  Maria Clark  has presented today for surgery, with the diagnosis of K80.20 Cholelithiasis w/o cholecystitis.  The various methods of treatment have been discussed with the patient and family. After consideration of risks, benefits and other options for treatment, the patient has consented to  Procedure(s): XI ROBOTIC Gordon (N/A) as a surgical intervention.  The patient's history has been reviewed, patient examined, no change in status, stable for surgery.  I have reviewed the patient's chart and labs.  Questions were answered to the patient's satisfaction.     Herbert Pun

## 2020-10-16 NOTE — Discharge Instructions (Signed)
AMBULATORY SURGERY  DISCHARGE INSTRUCTIONS   1) The drugs that you were given will stay in your system until tomorrow so for the next 24 hours you should not:  A) Drive an automobile B) Make any legal decisions C) Drink any alcoholic beverage   2) You may resume regular meals tomorrow.  Today it is better to start with liquids and gradually work up to solid foods.  You may eat anything you prefer, but it is better to start with liquids, then soup and crackers, and gradually work up to solid foods.   3) Please notify your doctor immediately if you have any unusual bleeding, trouble breathing, redness and pain at the surgery site, drainage, fever, or pain not relieved by medication.    4) Additional Instructions:        Please contact your physician with any problems or Same Day Surgery at [phone], Monday through Friday 6 am to 4 pm, or [place] number at [phone]. Diet: Resume home heart healthy regular diet.   Activity: No heavy lifting >20 pounds (children, pets, laundry, garbage) or strenuous activity until follow-up, but light activity and walking are encouraged. Do not drive or drink alcohol if taking narcotic pain medications.  Wound care: May shower with soapy water and pat dry (do not rub incisions), but no baths or submerging incision underwater until follow-up. (no swimming)   Medications: Resume all home medications. For mild to moderate pain: acetaminophen (Tylenol) or ibuprofen (if no kidney disease). Combining Tylenol with alcohol can substantially increase your risk of causing liver disease. Narcotic pain medications, if prescribed, can be used for severe pain, though may cause nausea, constipation, and drowsiness. Do not combine Tylenol and Norco within a 6 hour period as Norco contains Tylenol. If you do not need the narcotic pain medication, you do not need to fill the prescription.  Call office ([phone]) at any time if any  questions, worsening pain, fevers/chills, bleeding, drainage from incision site, or other concerns.  

## 2020-10-16 NOTE — Anesthesia Preprocedure Evaluation (Signed)
Anesthesia Evaluation  Patient identified by MRN, date of birth, ID band Patient awake    Reviewed: Allergy & Precautions, H&P , NPO status , reviewed documented beta blocker date and time   History of Anesthesia Complications (+) history of anesthetic complications  Airway Mallampati: III  TM Distance: >3 FB Neck ROM: full    Dental  (+) Teeth Intact   Pulmonary asthma , sleep apnea and Continuous Positive Airway Pressure Ventilation ,  Education done re: GA & OSA   Pulmonary exam normal        Cardiovascular hypertension, Normal cardiovascular exam     Neuro/Psych  Headaches, PSYCHIATRIC DISORDERS Depression    GI/Hepatic GERD  Controlled and Medicated,  Endo/Other  Morbid obesity  Renal/GU Renal disease     Musculoskeletal   Abdominal   Peds  Hematology  (+) Blood dyscrasia, anemia ,   Anesthesia Other Findings Past Medical History: No date: Anemia No date: Asthma No date: Complication of anesthesia     Comment:  woozy out of it for more than a day previously No date: Depression No date: Endometriosis No date: Mental disorder     Comment:  Depression No date: Sleep apnea Past Surgical History: No date: ANTERIOR CRUCIATE LIGAMENT REPAIR     Comment:  right x 2, left x 1 No date: LAPAROSCOPY BMI    Body Mass Index: 43.42 kg/m     Reproductive/Obstetrics                             Anesthesia Physical Anesthesia Plan  ASA: III  Anesthesia Plan: General   Post-op Pain Management:    Induction: Intravenous  PONV Risk Score and Plan: 3 and Ondansetron, Dexamethasone, Midazolam, Diphenhydramine and Treatment may vary due to age or medical condition  Airway Management Planned: Oral ETT  Additional Equipment:   Intra-op Plan:   Post-operative Plan: Extubation in OR  Informed Consent: I have reviewed the patients History and Physical, chart, labs and discussed the  procedure including the risks, benefits and alternatives for the proposed anesthesia with the patient or authorized representative who has indicated his/her understanding and acceptance.     Dental Advisory Given  Plan Discussed with: CRNA  Anesthesia Plan Comments:         Anesthesia Quick Evaluation

## 2020-10-16 NOTE — Transfer of Care (Signed)
Immediate Anesthesia Transfer of Care Note  Patient: Maria Clark  Procedure(s) Performed: XI ROBOTIC ASSISTED LAPAROSCOPIC CHOLECYSTECTOMY (N/A Abdomen) INDOCYANINE GREEN FLUORESCENCE IMAGING (ICG)  Patient Location: PACU  Anesthesia Type:General  Level of Consciousness: drowsy and patient cooperative  Airway & Oxygen Therapy: Patient Spontanous Breathing and Patient connected to face mask oxygen  Post-op Assessment: Report given to RN and Post -op Vital signs reviewed and stable  Post vital signs: Reviewed and stable  Last Vitals:  Vitals Value Taken Time  BP 97/45 10/16/20 1007  Temp 36.7 C 10/16/20 1005  Pulse 78 10/16/20 1011  Resp 16 10/16/20 1011  SpO2 100 % 10/16/20 1011  Vitals shown include unvalidated device data.  Last Pain:  Vitals:   10/16/20 1005  TempSrc:   PainSc: 0-No pain         Complications: No complications documented.

## 2020-10-16 NOTE — Op Note (Signed)
Preoperative diagnosis: Cholelithiasis   Postoperative diagnosis: Cholelithiasis  Procedure: Robotic Assisted Laparoscopic Cholecystectomy.   Anesthesia: GETA   Surgeon: Dr. Windell Moment  Wound Classification: Clean Contaminated  Indications: Patient is a 49 y.o. female developed right upper quadrant pain and on workup was found to have cholelithiasis with a normal common duct. Robotic Assisted Laparoscopic cholecystectomy was elected.  Findings: Thickened peritoneum covering the gallbladder Gallbladder with multiple large stones.  Critical view of safety achieved Cystic duct and artery identified, ligated and divided Adequate hemostasis  Description of procedure: The patient was placed on the operating table in the supine position. General anesthesia was induced. A time-out was completed verifying correct patient, procedure, site, positioning, and implant(s) and/or special equipment prior to beginning this procedure. An orogastric tube was placed. The abdomen was prepped and draped in the usual sterile fashion.  An incision was made in a natural skin line below the umbilicus.  The fascia was elevated and the Veress needle inserted. Proper position was confirmed by aspiration and saline meniscus test.  The abdomen was insufflated with carbon dioxide to a pressure of 15 mmHg. The patient tolerated insufflation well. A 8-mm trocar was then inserted in optiview fashion.  The laparoscope was inserted and the abdomen inspected. No injuries from initial trocar placement were noted. Additional trocars were then inserted in the following locations: an 8-mm trocar in the left lateral abdomen, and another two 8-mm trocars to the right side of the abdomen 5 cm appart. The umbilical trocar was changed to a 12 mm trocar all under direct visualization. The abdomen was inspected and no abnormalities were found. The table was placed in the reverse Trendelenburg position with the right side up. The robotic  arms were docked and target anatomy identified. Instrument inserted under direct visualization.  Filmy adhesions between the gallbladder and omentum, duodenum and transverse colon were lysed with electrocautery. The dome of the gallbladder was grasped with a prograsp and retracted over the dome of the liver. The infundibulum was also grasped with an atraumatic grasper and retracted toward the right lower quadrant. This maneuver exposed Calot's triangle. The peritoneum overlying the gallbladder infundibulum was then incised and the cystic duct and cystic artery identified and circumferentially dissected. Critical view of safety reviewed before ligating any structure. Firefly images taken to visualize biliary ducts. The cystic duct and cystic artery were then doubly clipped and divided close to the gallbladder.  The gallbladder was then dissected from its peritoneal attachments by electrocautery. Hemostasis was checked and the gallbladder and contained stones were removed using an endoscopic retrieval bag. The gallbladder was passed off the table as a specimen. The gallbladder fossa was copiously irrigated with saline and hemostasis was obtained. There was no evidence of bleeding from the gallbladder fossa or cystic artery or leakage of the bile from the cystic duct stump. Secondary trocars were removed under direct vision. No bleeding was noted. The robotic arms were undoked. The scope was withdrawn and the umbilical trocar removed. The abdomen was allowed to collapse. The fascia of the 2mm trocar sites was closed with figure-of-eight 0 vicryl sutures. The skin was closed with subcuticular sutures of 4-0 monocryl and topical skin adhesive. The orogastric tube was removed.  The patient tolerated the procedure well and was taken to the postanesthesia care unit in stable condition.   Specimen: Gallbladder  Complications: None  EBL: 10 mL

## 2020-10-18 LAB — SURGICAL PATHOLOGY

## 2020-10-20 ENCOUNTER — Institutional Professional Consult (permissible substitution): Payer: 59 | Admitting: Pulmonary Disease

## 2020-11-16 ENCOUNTER — Institutional Professional Consult (permissible substitution): Payer: 59 | Admitting: Pulmonary Disease

## 2020-11-24 ENCOUNTER — Ambulatory Visit (INDEPENDENT_AMBULATORY_CARE_PROVIDER_SITE_OTHER): Payer: 59 | Admitting: Pulmonary Disease

## 2020-11-24 ENCOUNTER — Encounter: Payer: Self-pay | Admitting: Pulmonary Disease

## 2020-11-24 ENCOUNTER — Other Ambulatory Visit: Payer: Self-pay

## 2020-11-24 VITALS — BP 116/82 | HR 83 | Temp 97.4°F | Ht 66.0 in | Wt 266.2 lb

## 2020-11-24 DIAGNOSIS — G4733 Obstructive sleep apnea (adult) (pediatric): Secondary | ICD-10-CM | POA: Diagnosis not present

## 2020-11-24 NOTE — Progress Notes (Signed)
Maria Clark    [id number]    Mar 06, 1971  Primary Care Physician:Cody, Jobe Marker, MD  Referring Physician: Lesleigh Noe, [place],   [id number]  Chief complaint:   Patient is being seen for obstructive sleep apnea Currently on BiPAP 13/9 Machine is probably up to 15 years She does have excessive daytime sleepiness  HPI:  Diagnosed with sleep apnea about 15 years ago Has been using BiPAP What she recollects about the study was that she has upper airway resistance  She has been on the same setting for many years She has gained over 100 pounds since then  Currently has excessive daytime sleepiness She sometimes gets sleepy driving-has not had any accidents  Admits to excessive daytime sleepiness Admits to dryness of her mouth She uses nasal pillows currently Usually goes to bed between 10 and 10:30 PM 15 to 20 minutes to fall asleep Wakes up frequently Final wake up time approximately about 6 AM Last study was performed approximately 2005  Currently on BiPAP 13/9  Never smoker   Outpatient Encounter Medications as of 11/24/2020  Medication Sig  . acetaminophen (TYLENOL) 500 MG tablet Take 1,000 mg by mouth every 6 (six) hours as needed for moderate pain or headache.  . albuterol (VENTOLIN HFA) 108 (90 Base) MCG/ACT inhaler Inhale 2 puffs into the lungs every 6 (six) hours as needed for wheezing or shortness of breath.  [name] b complex vitamins capsule Take 1 capsule by mouth daily.  [name] buPROPion (WELLBUTRIN XL) 150 MG 24 hr tablet Take 2 tablets (300 mg total) by mouth daily. 2 150mg  Daily (Patient taking differently: Take 300 mg by mouth daily.)  . Carboxymethylcellul-Glycerin (LUBRICATING EYE DROPS OP) Place 1 drop into both eyes daily as needed (dry eyes).  . CHARCOAL PO Take 1 tablet by mouth daily as needed (gas).  . Cholecalciferol (VITAMIN D) 50 MCG (2000 UT) tablet Take 2,000 Units by mouth daily.  . Ferrous Sulfate  (IRON PO) Take 1 tablet by mouth daily.  [name] FLUoxetine (PROZAC) 40 MG capsule Take 2 capsules (80 mg total) by mouth daily. 2 40mg  Tablets (Patient taking differently: Take 80 mg by mouth daily.)  . ibuprofen (ADVIL) 200 MG tablet Take 400 mg by mouth every 6 (six) hours as needed for headache or moderate pain.  . montelukast (SINGULAIR) 10 MG tablet Take 1 tablet (10 mg total) by mouth at bedtime.  . norethindrone (AYGESTIN) 5 MG tablet Take 1 tablet (5 mg total) by mouth daily.  . [DISCONTINUED] ondansetron (ZOFRAN ODT) 4 MG disintegrating tablet Take 1 tablet (4 mg total) by mouth every 8 (eight) hours as needed. (Patient taking differently: Take 4 mg by mouth every 8 (eight) hours as needed for nausea or vomiting. )   No facility-administered encounter medications on file as of 11/24/2020.    Allergies as of 11/24/2020  . (No Known Allergies)    Past Medical History:  Diagnosis Date  . Anemia   . Asthma   . Complication of anesthesia    woozy out of it for more than a day previously  . Depression   . Endometriosis   . Mental disorder    Depression  . Sleep apnea     Past Surgical History:  Procedure Laterality Date  . ANTERIOR CRUCIATE LIGAMENT REPAIR     right x 2, left x 1  . LAPAROSCOPY      Family History  Problem  Relation Age of Onset  . Hypertension Mother   . Arthritis Mother     Social History   Socioeconomic History  . Marital status: Married    Spouse name: Anda Kraft  . Number of children: 0  . Years of education: Master's Degree  . Highest education level: Not on file  Occupational History  . Not on file  Tobacco Use  . Smoking status: Never Smoker  . Smokeless tobacco: Never Used  Vaping Use  . Vaping Use: Never used  Substance and Sexual Activity  . Alcohol use: Yes    Comment: rarely  . Drug use: Never  . Sexual activity: Yes    Comment: female partners  Other Topics Concern  . 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   Psychiatric/Behavioral: Positive for sleep disturbance.  All other systems reviewed and are negative.   Vitals:   11/24/20 1042  BP: 116/82  Pulse: 83  Temp: (!) 97.4 F (36.3 C)  SpO2: 99%     Physical Exam Constitutional:      Appearance: She is obese.  HENT:     Head: Normocephalic.     Mouth/Throat:     Mouth: Mucous membranes are moist.     Comments: Mallampati 3, macroglossia, crowded oropharynx Eyes:     General:        Right eye: No discharge.        Left eye: No discharge.  Cardiovascular:     Rate and Rhythm: Normal rate and regular rhythm.     Heart sounds: No murmur heard. No friction rub.  Pulmonary:     Effort: No respiratory distress.     Breath sounds: No stridor. No wheezing or rhonchi.  Musculoskeletal:     Cervical back: No rigidity or tenderness.  Neurological:     Mental Status: She is alert.  Psychiatric:        Mood and Affect: Mood normal.    Results of the Epworth flowsheet 11/24/2020  Sitting and reading 3  Watching TV 1  Sitting, inactive in a public place (e.g. a theatre or a meeting) 2  As a passenger in a car for an hour without a break 3  Lying down to  rest in the afternoon when circumstances permit 3  Sitting and talking to someone 0  Sitting quietly after a lunch without alcohol 1  In a car, while stopped for a few minutes in traffic 2  Total score 15    Data Reviewed: Previous study  not available  Assessment:  History of obstructive sleep apnea on BiPAP therapy 13/9 -Pressure may be suboptimal at present with a weight gain -currently does not have a DME supplier  Excessive daytime sleepiness  Obesity  Pathophysiology of sleep disordered breathing discussed  Plan/Recommendations: We will try and get the patient scheduled for home sleep study however an in lab study will be considered as well  Study was so long ago and she is put on at least 100 pounds since her study  It is essential to confirm that she has significant obstructive sleep apnea and treatment options  Encouraged to call with any significant concerns  Tentative follow-up in about 3 months   Sherrilyn Rist MD [facility] 11/24/2020, 10:46 AM  CC: Lesleigh Noe, MD

## 2020-11-24 NOTE — Patient Instructions (Signed)
Excessive daytime sleepiness despite BiPAP use Pressure requirement is likely higher currently  We will schedule you for an in lab study-split-night study will be ordered-we will try and diagnose and treat you on the same night  We will also have you on the schedule for a home sleep study  -Hopefully we can get one performed soonest  Contrave is the medication that I was talking about regarding weight loss efforts-look up more information about it and discuss it with your primary  Continue current BiPAP  Continue weight loss efforts  Call with significant concerns   Tentative follow-up in 3 months

## 2020-12-06 ENCOUNTER — Ambulatory Visit (INDEPENDENT_AMBULATORY_CARE_PROVIDER_SITE_OTHER): Payer: 59 | Admitting: Family Medicine

## 2020-12-06 ENCOUNTER — Other Ambulatory Visit: Payer: Self-pay

## 2020-12-06 VITALS — BP 122/80 | HR 71 | Temp 97.6°F | Ht 66.0 in | Wt 266.5 lb

## 2020-12-06 DIAGNOSIS — I10 Essential (primary) hypertension: Secondary | ICD-10-CM

## 2020-12-06 DIAGNOSIS — F3342 Major depressive disorder, recurrent, in full remission: Secondary | ICD-10-CM | POA: Diagnosis not present

## 2020-12-06 DIAGNOSIS — G4733 Obstructive sleep apnea (adult) (pediatric): Secondary | ICD-10-CM | POA: Diagnosis not present

## 2020-12-06 NOTE — Patient Instructions (Addendum)
Could consider Contrave  Would recommend continuing to work on diet and exercise  Here is what I would recommend for weight loss:  1) Increase the amount of water you drink a day > specifically drink a glass of water 8 oz or more before every meal 2) Could start a fiber supplement (like metamucil) > You could take this up to 3 times a day, but I would start with 1 time a day until you get used to it. It may cause some stomach upset 3) Make sure you sit down to eat and eat slowly (cut meat one piece at a time) > specifically train your body to eat only at the table (avoiding snacking in front of the TV) 4) Fill up on healthy items first > consider eating a salad with low calorie salad dressing before every meal  5) Make 1/2 of your plate vegetables 6) Keep healthy snacks available for those times when you are bored 7) Consider using a calorie counting app like MyFitnessPal > counting calories helps you make wise choices around snacking. Or if you can afford it you could sign up for Weight Watchers -- and learn about healthy options through a point system    Post-nasal drip - try flonase 1-2 times a day - saline rinse - neti pot

## 2020-12-06 NOTE — Progress Notes (Signed)
Subjective:     Maria Clark is a 49 y.o. female presenting for Follow-up (3 month- BP )     HPI   #Depression - saw the nurse practioner  - had genetic testing and told she does not process folate well - is planning to start a supplement - this can impact mood so she will see how the supplement helps  #OSA - saw the sleep doctor due to her daytime sleepiness - weight gain since the last sleep study - she is scheduled for a new sleep study   #weight loss - her pulmonologist recommended she - on an 8 week program with [facility] [employer] - fitness couching and weight loss coaching over the phone - lost 5 lbs in the November - maintained in December   Review of Systems  09/11/2020: Clinic - HTN - home monitoring, OSA re-evaluation.   Social History   Tobacco Use  Smoking Status Never Smoker  Smokeless Tobacco Never Used        Objective:    BP Readings from Last 3 Encounters:  12/06/20 122/80  11/24/20 116/82  10/16/20 (!) 106/59   Wt Readings from Last 3 Encounters:  12/06/20 266 lb 8 oz (120.9 kg)  11/24/20 266 lb 4 oz (120.8 kg)  10/16/20 269 lb (122 kg)    BP 122/80   Pulse 71   Temp 97.6 F (36.4 C) (Temporal)   Ht 5\' 6"  (1.676 m)   Wt 266 lb 8 oz (120.9 kg)   SpO2 98%   BMI 43.01 kg/m    Physical Exam Constitutional:      General: She is not in acute distress.    Appearance: She is well-developed. She is not diaphoretic.  HENT:     Right Ear: External ear normal.     Left Ear: External ear normal.  Eyes:     Conjunctiva/sclera: Conjunctivae normal.  Cardiovascular:     Rate and Rhythm: Normal rate.  Pulmonary:     Effort: Pulmonary effort is normal.  Musculoskeletal:     Cervical back: Neck supple.  Skin:    General: Skin is warm and dry.     Capillary Refill: Capillary refill takes less than 2 seconds.  Neurological:     Mental Status: She is alert. Mental status is at baseline.  Psychiatric:        Mood and Affect: Mood  normal.        Behavior: Behavior normal.           Assessment & Plan:   Problem List Items Addressed This Visit      Cardiovascular and Mediastinum   Essential hypertension - Primary    BP normal. Continue healthy diet and exercise. No need for medication        Respiratory   OSA treated with BiPAP    Reviewed sleep medicine note. Due to weight gain they will repeat sleep study. Appreciate support.         Other   Major depressive disorder, recurrent episode, in full remission ([place])    Currently doing well. Saw psych, appreciate support. Cont fluoxetine 80 mg and buproprion 300 mg.       Morbid obesity ([place])    She is working on weight loss with healthy diet/exercise and lost 5 lbs. Discussed possible option of medication to help. She is not interested in surgery - her month had surgery w/o success. Consider wellbutrin-naltrexone but this would involve changing her psych meds. She does not have  diabetes/prediabetes but discussed ozempic could be an option but she is not very interested in injections at this time. She will continue health coach and lifestyle efforts. Mychart if wanting to try Contrave and will plan for f/u 4 weeks after starting.           Return in about 6 months (around 06/06/2021).  Lesleigh Noe, MD  This visit occurred during the SARS-CoV-2 public health emergency.  Safety protocols were in place, including screening questions prior to the visit, additional usage of staff PPE, and extensive cleaning of exam room while observing appropriate contact time as indicated for disinfecting solutions.

## 2020-12-07 NOTE — Assessment & Plan Note (Signed)
BP normal. Continue healthy diet and exercise. No need for medication

## 2020-12-07 NOTE — Assessment & Plan Note (Signed)
She is working on weight loss with healthy diet/exercise and lost 5 lbs. Discussed possible option of medication to help. She is not interested in surgery - her month had surgery w/o success. Consider wellbutrin-naltrexone but this would involve changing her psych meds. She does not have diabetes/prediabetes but discussed ozempic could be an option but she is not very interested in injections at this time. She will continue health coach and lifestyle efforts. Mychart if wanting to try Contrave and will plan for f/u 4 weeks after starting.

## 2020-12-07 NOTE — Assessment & Plan Note (Signed)
Reviewed sleep medicine note. Due to weight gain they will repeat sleep study. Appreciate support.

## 2020-12-07 NOTE — Assessment & Plan Note (Signed)
Currently doing well. Saw psych, appreciate support. Cont fluoxetine 80 mg and buproprion 300 mg.

## 2020-12-11 ENCOUNTER — Ambulatory Visit: Payer: 59 | Admitting: Family Medicine

## 2021-01-14 ENCOUNTER — Other Ambulatory Visit: Payer: Self-pay

## 2021-01-14 ENCOUNTER — Ambulatory Visit (HOSPITAL_BASED_OUTPATIENT_CLINIC_OR_DEPARTMENT_OTHER): Payer: BC Managed Care – PPO | Attending: Pulmonary Disease | Admitting: Pulmonary Disease

## 2021-01-14 DIAGNOSIS — G4733 Obstructive sleep apnea (adult) (pediatric): Secondary | ICD-10-CM | POA: Diagnosis present

## 2021-01-14 DIAGNOSIS — G4763 Sleep related bruxism: Secondary | ICD-10-CM | POA: Diagnosis not present

## 2021-01-14 DIAGNOSIS — Z9989 Dependence on other enabling machines and devices: Secondary | ICD-10-CM | POA: Insufficient documentation

## 2021-01-14 DIAGNOSIS — G4736 Sleep related hypoventilation in conditions classified elsewhere: Secondary | ICD-10-CM | POA: Diagnosis not present

## 2021-01-23 ENCOUNTER — Telehealth: Payer: Self-pay | Admitting: Pulmonary Disease

## 2021-01-23 DIAGNOSIS — G4733 Obstructive sleep apnea (adult) (pediatric): Secondary | ICD-10-CM

## 2021-01-23 NOTE — Telephone Encounter (Signed)
Order has been placed for the pt to be set up with BIPAP with a new DME company since she has not been with one before.  Nothing further is needed.

## 2021-01-23 NOTE — Procedures (Signed)
POLYSOMNOGRAPHY  Last, First: Maria Clark, Maria Clark MRN: [id number] Gender: Female Age (years): 6 Weight (lbs): 266 DOB: 04-14-1971 BMI: 43 Primary Care: No PCP Epworth Score: 13 Referring: Laurin Coder MD Technician: Zadie Rhine Interpreting: Laurin Coder MD Study Type: NPSG Ordered Study Type: Split Night CPAP Study date: 01/14/2021 Location: [facility] CLINICAL INFORMATION Maria Clark is a 50 year old Female and was referred to the sleep center for evaluation of G47.30 Sleep Apnea, Unspecified (780.57). Indications include OSA, Snoring.  MEDICATIONS Patient self administered medications include: N/A. Medications administered during study include No sleep medicine administered.  SLEEP STUDY TECHNIQUE A multi-channel overnight Polysomnography study was performed. The channels recorded and monitored were central and occipital EEG, electrooculogram (EOG), submentalis EMG (chin), nasal and oral airflow, thoracic and abdominal wall motion, anterior tibialis EMG, snore microphone, electrocardiogram, and a pulse oximetry. TECHNICIAN COMMENTS Comments added by Technician: PT HAD ONE RESTROOM VISTED. Patient had difficulty initiating sleep. Patient was restless all through the night. Comments added by Scorer: N/A SLEEP ARCHITECTURE The study was initiated at 9:54:35 PM and terminated at 4:30:33 AM. The total recorded time was 396 minutes. EEG confirmed total sleep time was 316 minutes yielding a sleep efficiency of 79.8%%. Sleep onset after lights out was 11.9 minutes with a REM latency of 159.0 minutes. The patient spent 16.3%% of the night in stage N1 sleep, 63.0%% in stage N2 sleep, 0.6%% in stage N3 and 20.1% in REM. Wake after sleep onset (WASO) was 68.1 minutes. The Arousal Index was 47.8/hour. RESPIRATORY PARAMETERS There were a total of 27 respiratory disturbances out of which 0 were apneas ( 0 obstructive, 0 mixed, 0 central) and 27 hypopneas. The apnea/hypopnea index  (AHI) was 5.1 events/hour. The central sleep apnea index was 0.0 events/hour. The REM AHI was 5.7 events/hour and NREM AHI was 5.0 events/hour. The supine AHI was 6.3 events/hour and the non supine AHI was 3.7 supine during 54.06% of sleep. Respiratory disturbances were associated with oxygen desaturation down to a nadir of 89.0% during sleep. The mean oxygen saturation during the study was 94.3%. The cumulative time under 88% oxygen saturation was 5.5 minutes.  LEG MOVEMENT DATA The total leg movements were 0 with a resulting leg movement index of 0.0/hr .Associated arousal with leg movement index was 0.0/hr.  CARDIAC DATA The underlying cardiac rhythm was most consistent with sinus rhythm. Mean heart rate during sleep was 83.6 bpm. Additional rhythm abnormalities include None.   IMPRESSIONS - Mild Obstructive Sleep apnea(OSA) - EKG showed no cardiac abnormalities. - Mild Oxygen Desaturation - The patient snored with moderate snoring volume. - No significant periodic leg movements(PLMs) during sleep. However, no significant associated arousals.   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 - Sleep hygiene should be reviewed to assess factors that may improve sleep quality. - Weight management and regular exercise should be initiated or continued.  [Electronically signed] 01/23/2021 08:43 AM  Sherrilyn Rist MD NPI: [id number]

## 2021-01-23 NOTE — Telephone Encounter (Signed)
Sleep study result  Date of study: 01/14/2021  Impression: Mild obstructive sleep apnea Bruxism  Recommendation: DME referral  Auto titrating BiPAP  Auto BIPAP, settings Max IPAP 25,Min EPAP 4, PS min2, PS max 8 with Biflex 2, with heated humidification  Encourage weight loss measures  Follow-up in the office 4 to 6 weeks following initiation of treatment    Note: I have called the patient and discussed study result  She does not have a current DME company Need to set up DME with above settings and then let patient know who she should expect a call from

## 2021-02-20 ENCOUNTER — Ambulatory Visit: Payer: Self-pay | Admitting: Pulmonary Disease

## 2021-04-05 NOTE — Telephone Encounter (Signed)
mychart message sent by pt:  To: [facility]    From: Maria Brunner Aten "AJ"    Created: 04/05/2021 8:47 AM     *-*-*This message was handled on 04/05/2021 9:12 AM by Eve Rey P*-*-*  Hi, after my sleep study the doctor called and said he would make a referral for a bipap machine that self regulates, as my current machine is probably too low of a pressure. I was never contacted by a durable medical equipment company and still feel a need for a new machine. Can the referral still be facilitated? Thanks.     I looked at pt's chart to make sure that an order was placed after pt had the sleep study and see that an order was placed 01/23/21.  PCCs, is there any way you can take a look at this to see if there were any issues with the order that was placed? I tried to see in the referrals which DME the order had been sent to but I am not seeing any info about that.

## 2021-05-08 ENCOUNTER — Encounter: Payer: Self-pay | Admitting: Radiology

## 2021-05-29 ENCOUNTER — Other Ambulatory Visit: Payer: Self-pay

## 2021-05-29 DIAGNOSIS — J4520 Mild intermittent asthma, uncomplicated: Secondary | ICD-10-CM

## 2021-05-29 MED ORDER — MONTELUKAST SODIUM 10 MG PO TABS: 10.0000 mg | ORAL_TABLET | Freq: Every day | ORAL | 1 refills | Status: DC

## 2021-06-26 ENCOUNTER — Ambulatory Visit (INDEPENDENT_AMBULATORY_CARE_PROVIDER_SITE_OTHER): Payer: 59 | Admitting: Family Medicine

## 2021-06-26 ENCOUNTER — Other Ambulatory Visit: Payer: Self-pay

## 2021-06-26 ENCOUNTER — Encounter: Payer: Self-pay | Admitting: Family Medicine

## 2021-06-26 VITALS — BP 130/80 | HR 61 | Temp 97.9°F | Ht 66.0 in | Wt 268.5 lb

## 2021-06-26 DIAGNOSIS — I10 Essential (primary) hypertension: Secondary | ICD-10-CM | POA: Diagnosis not present

## 2021-06-26 DIAGNOSIS — R5383 Other fatigue: Secondary | ICD-10-CM | POA: Diagnosis not present

## 2021-06-26 DIAGNOSIS — R5382 Chronic fatigue, unspecified: Secondary | ICD-10-CM | POA: Insufficient documentation

## 2021-06-26 DIAGNOSIS — E785 Hyperlipidemia, unspecified: Secondary | ICD-10-CM | POA: Insufficient documentation

## 2021-06-26 DIAGNOSIS — G4733 Obstructive sleep apnea (adult) (pediatric): Secondary | ICD-10-CM

## 2021-06-26 DIAGNOSIS — E782 Mixed hyperlipidemia: Secondary | ICD-10-CM | POA: Diagnosis not present

## 2021-06-26 LAB — CBC
HCT: 39.6 % (ref 36.0–46.0)
Hemoglobin: 13.3 g/dL (ref 12.0–15.0)
MCHC: 33.7 g/dL (ref 30.0–36.0)
MCV: 88.8 fl (ref 78.0–100.0)
Platelets: 311 10*3/uL (ref 150.0–400.0)
RBC: 4.46 Mil/uL (ref 3.87–5.11)
RDW: 14.3 % (ref 11.5–15.5)
WBC: 7.7 10*3/uL (ref 4.0–10.5)

## 2021-06-26 LAB — LIPID PANEL
Cholesterol: 142 mg/dL (ref 0–200)
HDL: 37.1 mg/dL — ABNORMAL LOW (ref >39.00)
LDL Cholesterol: 81 mg/dL (ref 0–99)
NonHDL: 104.79
Total CHOL/HDL Ratio: 4
Triglycerides: 120 mg/dL (ref 0.0–149.0)
VLDL: 24 mg/dL (ref 0.0–40.0)

## 2021-06-26 LAB — VITAMIN B12: Vitamin B-12: 753 pg/mL (ref 211–911)

## 2021-06-26 LAB — COMPREHENSIVE METABOLIC PANEL
ALT: 23 U/L (ref 0–35)
AST: 16 U/L (ref 0–37)
Albumin: 4.3 g/dL (ref 3.5–5.2)
Alkaline Phosphatase: 111 U/L (ref 39–117)
BUN: 12 mg/dL (ref 6–23)
CO2: 27 mEq/L (ref 19–32)
Calcium: 9.5 mg/dL (ref 8.4–10.5)
Chloride: 103 mEq/L (ref 96–112)
Creatinine, Ser: 0.78 mg/dL (ref 0.40–1.20)
GFR: 89.98 mL/min (ref >60.00)
Glucose, Bld: 87 mg/dL (ref 70–99)
Potassium: 4.4 mEq/L (ref 3.5–5.1)
Sodium: 138 mEq/L (ref 135–145)
Total Bilirubin: 0.5 mg/dL (ref 0.2–1.2)
Total Protein: 7.4 g/dL (ref 6.0–8.3)

## 2021-06-26 LAB — FERRITIN: Ferritin: 45.4 ng/mL (ref 10.0–291.0)

## 2021-06-26 NOTE — Assessment & Plan Note (Signed)
Likely 2/2 to undertreated OSA. Will check ferritin and b12 to rule these out. Previous TSH and Vit D were normal. Encouraged regular exercise and OSA treatment with new machine

## 2021-06-26 NOTE — Progress Notes (Signed)
Subjective:     Maria Clark is a 50 y.o. female presenting for Follow-up (6 month ) and Fatigue (Getting new bipap machine )     HPI  #OSA - has not gotten her new machine yet - visit in 12/2020  - has follow-up this Thursday  #sleepiness - just started armodafinil  - seeing psych np  #fatigue - has been told of low iron when giving blood - taking supplement - not on special diet - taking progestrone only pill to stop cycles  #obesity - did not gain weight this year - this is a big deal for her - has not been exercising - has been trying to watch what she eats  Review of Systems  12/06/2020: Clinic - obesity - health coach and lifestyle - declined medications.  01/14/2021: Sleep - BIPAP, consider oral bite guard  Social History   Tobacco Use  Smoking Status Never  Smokeless Tobacco Never        Objective:    BP Readings from Last 3 Encounters:  06/26/21 130/80  12/06/20 122/80  11/24/20 116/82   Wt Readings from Last 3 Encounters:  06/26/21 268 lb 8 oz (121.8 kg)  01/14/21 266 lb (120.7 kg)  12/06/20 266 lb 8 oz (120.9 kg)    BP 130/80   Pulse 61   Temp 97.9 F (36.6 C) (Temporal)   Ht 5\' 6"  (1.676 m)   Wt 268 lb 8 oz (121.8 kg)   SpO2 97%   BMI 43.34 kg/m    Physical Exam Constitutional:      General: She is not in acute distress.    Appearance: She is well-developed. She is not diaphoretic.  HENT:     Right Ear: External ear normal.     Left Ear: External ear normal.     Nose: Nose normal.  Eyes:     Conjunctiva/sclera: Conjunctivae normal.  Cardiovascular:     Rate and Rhythm: Normal rate and regular rhythm.     Heart sounds: No murmur heard. Pulmonary:     Effort: Pulmonary effort is normal. No respiratory distress.     Breath sounds: Normal breath sounds. No wheezing.  Musculoskeletal:     Cervical back: Neck supple.  Skin:    General: Skin is warm and dry.     Capillary Refill: Capillary refill takes less  than 2 seconds.  Neurological:     Mental Status: She is alert. Mental status is at baseline.  Psychiatric:        Mood and Affect: Mood normal.        Behavior: Behavior normal.          Assessment & Plan:   Problem List Items Addressed This Visit       Cardiovascular and Mediastinum   Essential hypertension - Primary    BP controled. Propranolol 20 mg bid.        Relevant Medications   propranolol (INDERAL) 20 MG tablet   Other Relevant Orders   Comprehensive metabolic panel   CBC     Respiratory   OSA treated with BiPAP    Unfortunately she has not received her new BIPAP but will this week. Advised calling if fatigue/sleepiness not improved on new setting       Relevant Orders   Ferritin     Other   Morbid obesity ([place])    Patient notes significant change is no weight gain this year. Encouraged healthy diet and regular exercise. She will reach out  if wanting to try medication       Relevant Medications   Armodafinil 150 MG tablet   Other Relevant Orders   Ferritin   Vitamin B12   Hyperlipidemia    Encouraged healthy diet/exercise. Recheck lipids. The 10-year ASCVD risk score Mikey Bussing DC Brooke Bonito., et al., 2013) is: 1.7%   Values used to calculate the score:     Age: 9 years     Sex: Female     Is Non-Hispanic African American: No     Diabetic: No     Tobacco smoker: No     Systolic Blood Pressure: 947 mmHg     Is BP treated: Yes     HDL Cholesterol: 39 mg/dL     Total Cholesterol: 162 mg/dL        Relevant Medications   propranolol (INDERAL) 20 MG tablet   Other Relevant Orders   Lipid panel   Other fatigue    Likely 2/2 to undertreated OSA. Will check ferritin and b12 to rule these out. Previous TSH and Vit D were normal. Encouraged regular exercise and OSA treatment with new machine       Relevant Orders   Ferritin   Vitamin B12     Return in about 1 year (around 06/26/2022).  Lesleigh Noe, MD  This visit occurred during the SARS-CoV-2  public health emergency.  Safety protocols were in place, including screening questions prior to the visit, additional usage of staff PPE, and extensive cleaning of exam room while observing appropriate contact time as indicated for disinfecting solutions.

## 2021-06-26 NOTE — Assessment & Plan Note (Signed)
Patient notes significant change is no weight gain this year. Encouraged healthy diet and regular exercise. She will reach out if wanting to try medication

## 2021-06-26 NOTE — Assessment & Plan Note (Signed)
Unfortunately she has not received her new BIPAP but will this week. Advised calling if fatigue/sleepiness not improved on new setting

## 2021-06-26 NOTE — Assessment & Plan Note (Signed)
Encouraged healthy diet/exercise. Recheck lipids. The 10-year ASCVD risk score Mikey Bussing DC Brooke Bonito., et al., 2013) is: 1.7%   Values used to calculate the score:     Age: 50 years     Sex: Female     Is Non-Hispanic African American: No     Diabetic: No     Tobacco smoker: No     Systolic Blood Pressure: 440 mmHg     Is BP treated: Yes     HDL Cholesterol: 39 mg/dL     Total Cholesterol: 162 mg/dL

## 2021-06-26 NOTE — Patient Instructions (Signed)
#   Weight - continue to try to eat healthy - work on regular exercise - walking can be great   Fatigue - healthy diet - regular exercise - get new Bipap machine - labs today to assess other risk factors   Sleep hygiene checklist: 1. Avoid naps during the day 2. Avoid stimulants such as caffeine and nicotine. Avoid bedtime alcohol (it can speed onset of sleep but the body's metabolism can cause awakenings). At least 2 hours before bedtime 3. All forms of exercise help ensure sound sleep - limit vigorous exercise to morning or late afternoon 4. Avoid food too close to bedtime including chocolate (which contains caffeine) 5. Soak up natural light 6. Establish regular bedtime routine. 7. Associate bed with sleep - avoid TV, computer or phone, reading while in bed. 8. Ensure pleasant, relaxing sleep environment - quiet, dark, cool room.  Good Sleep Hygiene Habits -- Got to bed and wake up within an hour of the same time every day -- Avoid bright screens (from laptop, phone, TV) within at least 30 minutes before bed. The "blue light" supresses the sleep hormone melatonin and the content may stimulate as well -- Maintain a quiet and dark sleep environment (blackout curtains, turn on a fan or white noise to block out disruptive sounds) -- Practicing relaxing activites before bed (taking a shower, reading a book, journaling, meditation app) -- To quiet a busy mind -- consider journaling before bed (jotting down reminders, worry thoughts, as well as positive things like a gratitude list)   Begin a Mindfulness/Meditation practice -- this can take a little as 3 minutes -- You can find resources in books -- Or you can download apps like  ---- Headspace App (which currently has free content called "Weathering the Storm") ---- Calm (which has a few free options)  ---- Insignt Timer ---- Stop, Breathe & Think  # With each of these Apps - you should decline the "start free trial" offer and as you  search through the App should be able to access some of their free content. You can also chose to pay for the content if you find one that works well for you.   # Many of them also offer sleep specific content which may help with insomnia

## 2021-06-26 NOTE — Assessment & Plan Note (Signed)
BP controled. Propranolol 20 mg bid.

## 2021-06-27 ENCOUNTER — Encounter: Payer: Self-pay | Admitting: Family Medicine

## 2021-07-05 ENCOUNTER — Other Ambulatory Visit: Payer: Self-pay | Admitting: Family Medicine

## 2021-07-11 ENCOUNTER — Ambulatory Visit (INDEPENDENT_AMBULATORY_CARE_PROVIDER_SITE_OTHER): Payer: 59 | Admitting: Family Medicine

## 2021-07-11 ENCOUNTER — Other Ambulatory Visit: Payer: Self-pay

## 2021-07-11 ENCOUNTER — Encounter: Payer: Self-pay | Admitting: Family Medicine

## 2021-07-11 VITALS — BP 137/86 | HR 85 | Wt 266.0 lb

## 2021-07-11 DIAGNOSIS — R5383 Other fatigue: Secondary | ICD-10-CM

## 2021-07-11 DIAGNOSIS — N809 Endometriosis, unspecified: Secondary | ICD-10-CM | POA: Diagnosis not present

## 2021-07-11 DIAGNOSIS — Z01411 Encounter for gynecological examination (general) (routine) with abnormal findings: Secondary | ICD-10-CM

## 2021-07-11 NOTE — Assessment & Plan Note (Signed)
Continue Aygestin

## 2021-07-11 NOTE — Assessment & Plan Note (Signed)
Will check hormone levels, and see if might be getting to peri-menopause (suspect this is the case). Also add B12 and reduce iron to qod dosing with Vitamin C, latest studies suggest absorption is better this way.

## 2021-07-11 NOTE — Progress Notes (Signed)
Subjective:     Maria Clark is a 50 y.o. female and is here for a comprehensive physical exam. The patient reports problems - fatigue . Has had one episode of bleeding on Aygestin now and not bleeding. Also, with recent change in her BiPAP and normal B12, TSH, Vit D. Would like hormones checked. Denies hot flashes, night sweats. Sleeping 9+ hours/night and still taking naps to get through the day. Recently started on narcolepsy meds with minimal improvement. She has a ferritin at 50 and taking supplementation.  The following portions of the patient's history were reviewed and updated as appropriate: allergies, current medications, past family history, past medical history, past social history, past surgical history, and problem list.  Review of Systems Pertinent items noted in HPI and remainder of comprehensive ROS otherwise negative.   Objective:    BP 137/86   Pulse 85   Wt 266 lb (120.7 kg)   BMI 42.93 kg/m  General appearance: alert, cooperative, and appears stated age Head: Normocephalic, without obvious abnormality, atraumatic Neck: no adenopathy, supple, symmetrical, trachea midline, and thyroid not enlarged, symmetric, no tenderness/mass/nodules Lungs: clear to auscultation bilaterally Breasts: normal appearance, no masses or tenderness Heart: regular rate and rhythm, S1, S2 normal, no murmur, click, rub or gallop Abdomen: soft, non-tender; bowel sounds normal; no masses,  no organomegaly Extremities: Homans sign is negative, no sign of DVT Pulses: 2+ and symmetric Skin: Skin color, texture, turgor normal. No rashes or lesions Lymph nodes: Cervical, supraclavicular, and axillary nodes normal. Neurologic: Grossly normal    Assessment:    Healthy female exam.      Plan:     Problem List Items Addressed This Visit       Unprioritized   Endometriosis    Continue Aygestin       Other fatigue    Will check hormone levels, and see if might be getting to  peri-menopause (suspect this is the case). Also add B12 and reduce iron to qod dosing with Vitamin C, latest studies suggest absorption is better this way.       Relevant Orders   TestT+TestF+SHBG   Follicle stimulating hormone   Other Visit Diagnoses     Encounter for gynecological examination with abnormal finding    -  Primary   Pap WNL last year   Relevant Orders   MM 3D SCREEN BREAST BILATERAL      Return in 1 year (on 07/11/2022).  See After Visit Summary for Counseling Recommendations

## 2021-07-14 LAB — FOLLICLE STIMULATING HORMONE: FSH: 12.5 m[IU]/mL

## 2021-07-14 LAB — TESTT+TESTF+SHBG
Sex Hormone Binding: 19.5 nmol/L — ABNORMAL LOW (ref 24.6–122.0)
Testosterone, Free: 21.1 pg/mL — ABNORMAL HIGH (ref 0.0–4.2)
Testosterone, Total, LC/MS: 14.3 ng/dL

## 2021-07-16 ENCOUNTER — Telehealth: Payer: Self-pay | Admitting: Radiology

## 2021-07-16 NOTE — Telephone Encounter (Signed)
Left message for pt to call to schedule lab appointment in 6-12 weeks per dr Kennon Rounds.[name]

## 2021-08-06 ENCOUNTER — Other Ambulatory Visit: Payer: Self-pay | Admitting: Family Medicine

## 2021-08-06 DIAGNOSIS — F3341 Major depressive disorder, recurrent, in partial remission: Secondary | ICD-10-CM

## 2021-08-14 ENCOUNTER — Telehealth: Payer: Self-pay | Admitting: Pulmonary Disease

## 2021-08-14 NOTE — Telephone Encounter (Signed)
Compliance report reviewed dated 06/30/2021 to 07/29/2021  100% compliant with PAP device  Residual AHI of 0.8

## 2021-08-28 ENCOUNTER — Other Ambulatory Visit: Payer: 59

## 2021-08-28 ENCOUNTER — Other Ambulatory Visit: Payer: Self-pay

## 2021-08-28 DIAGNOSIS — R7989 Other specified abnormal findings of blood chemistry: Secondary | ICD-10-CM

## 2021-08-31 ENCOUNTER — Ambulatory Visit
Admission: RE | Admit: 2021-08-31 | Discharge: 2021-08-31 | Disposition: A | Discharge status: Home / Self Care | Payer: 59 | Source: Ambulatory Visit | Attending: Family Medicine | Admitting: Family Medicine

## 2021-08-31 ENCOUNTER — Other Ambulatory Visit: Payer: Self-pay

## 2021-08-31 DIAGNOSIS — Z01411 Encounter for gynecological examination (general) (routine) with abnormal findings: Secondary | ICD-10-CM

## 2021-08-31 LAB — TSH+PRL+TESTT+TESTF+17OHP
17-Hydroxyprogesterone: <10 ng/dL
Prolactin: 20.4 ng/mL (ref 4.8–23.3)
TSH: 1.21 u[IU]/mL (ref 0.450–4.500)
Testosterone, Free: 26.7 pg/mL — ABNORMAL HIGH (ref 0.0–4.2)
Testosterone, Total, LC/MS: 13.8 ng/dL

## 2021-09-04 ENCOUNTER — Other Ambulatory Visit: Payer: Self-pay | Admitting: Family Medicine

## 2021-09-04 DIAGNOSIS — R7989 Other specified abnormal findings of blood chemistry: Secondary | ICD-10-CM

## 2021-09-18 ENCOUNTER — Other Ambulatory Visit: Payer: Self-pay

## 2021-09-18 ENCOUNTER — Ambulatory Visit
Admission: RE | Admit: 2021-09-18 | Discharge: 2021-09-18 | Disposition: A | Discharge status: Home / Self Care | Payer: 59 | Source: Ambulatory Visit | Attending: Family Medicine | Admitting: Family Medicine

## 2021-09-18 DIAGNOSIS — R7989 Other specified abnormal findings of blood chemistry: Secondary | ICD-10-CM | POA: Diagnosis not present

## 2021-09-25 ENCOUNTER — Other Ambulatory Visit (INDEPENDENT_AMBULATORY_CARE_PROVIDER_SITE_OTHER): Payer: 59

## 2021-09-25 ENCOUNTER — Other Ambulatory Visit: Payer: Self-pay

## 2021-09-25 DIAGNOSIS — Z23 Encounter for immunization: Secondary | ICD-10-CM

## 2021-09-25 DIAGNOSIS — R7989 Other specified abnormal findings of blood chemistry: Secondary | ICD-10-CM

## 2021-09-25 NOTE — Progress Notes (Signed)
Patient seen and assessed by nursing staff.  Agree with documentation and plan.  

## 2021-09-25 NOTE — Progress Notes (Signed)
Patient requested Flu Vaccine while getting Labs.  Flu Vaccine given w/o any problems in RD. Flu hand out given to pt as well.

## 2021-09-26 LAB — DHEA-SULFATE: DHEA-SO4: 177 ug/dL (ref 41.2–243.7)

## 2021-10-24 ENCOUNTER — Encounter: Payer: Self-pay | Admitting: Endocrinology

## 2021-10-24 ENCOUNTER — Ambulatory Visit: Payer: 59 | Admitting: Endocrinology

## 2021-10-24 ENCOUNTER — Other Ambulatory Visit: Payer: Self-pay

## 2021-10-24 DIAGNOSIS — R739 Hyperglycemia, unspecified: Secondary | ICD-10-CM

## 2021-10-24 DIAGNOSIS — R635 Abnormal weight gain: Secondary | ICD-10-CM | POA: Diagnosis not present

## 2021-10-24 MED ORDER — DEXAMETHASONE 1 MG PO TABS: ORAL_TABLET | ORAL | 0 refills | Status: DC

## 2021-10-24 MED ORDER — SPIRONOLACTONE 50 MG PO TABS: 50.0000 mg | ORAL_TABLET | Freq: Every day | ORAL | 3 refills | Status: DC

## 2021-10-24 NOTE — Patient Instructions (Addendum)
Let's check a 24HR urine collection for cortisol.   After you are finished with that, you should do a "dexamethasone suppression test."  For this, you would take dexamethasone 1 mg at 10 pm (I have sent a prescription to your pharmacy), then come in for a "cortisol" blood test the next morning before 9 am.  You do not need to be fasting for this test.  We'll check the A1c at the same time.   Also, I have sent a prescription to your pharmacy, for a pill to reduce the effect of testosterone.  Please come back for a follow-up appointment in 3 months.     Bariatric Surgery You have so much to gain by losing weight.  You may have already tried every diet and exercise plan imaginable.  And, you may have sought advice from your family physician, too.   Sometimes, in spite of such diligent efforts, you may not be able to achieve long-term results by yourself.  In cases of severe obesity, bariatric or weight loss surgery is a proven method of achieving long-term weight control.  Our Services Our bariatric surgery programs offer our patients new hope and long-term weight-loss solution.  Since introducing our services in 2003, we have conducted more than 2,400 successful procedures.  Our program is designated as a Programmer, multimedia by the Metabolic and Bariatric Surgery Accreditation and Quality Improvement Program (MBSAQIP), a IT trainer that sets rigorous patient safety and outcome standards.  Our program is also designated as a Ecologist by [employer].   Our exceptional weight-loss surgery team specializes in diagnosis, treatment, follow-up care, and ongoing support for our patients with severe weight loss challenges.  We currently offer laparoscopic sleeve gastrectomy, gastric bypass, and adjustable gastric band (LAP-BAND).    Attend our [place] Choosing to undergo a bariatric procedure is a big decision, and one that should not be taken lightly.  You  now have two options in how you learn about weight-loss surgery - in person or online.  Our objective is to ensure you have all of the information that you need to evaluate the advantages and obligations of this life changing procedure.  Please note that you are not alone in this process, and our experienced team is ready to assist and answer all of your questions.  There are several ways to register for a seminar (either on-line or in person):  Call [phone] Go on-line to [facility] and register for either type of seminar.  [email or web address]

## 2021-10-24 NOTE — Progress Notes (Signed)
Subjective:    Patient ID: Maria Clark, female    DOB: February 26, 1971, 50 y.o.   MRN: [id number]  HPI Pt is referred by Dr Kennon Rounds, for high free testosterone.  Pt had menarche at usual age. She is G0.  She had unsuccessful rx of infertility (uncertain cause), in 5465.  She reports at age 22, she developed moderate hair on the face, and assoc weight gain.  She took oral contraceptives 1980-2021. She takes norethindrone for endometriosis.  On that, she has rare and light menses.  She has fatigue, chronic weight gain, difficulty with concentration, and facial acne.   Past Medical History:  Diagnosis Date   Anemia    Asthma    Complication of anesthesia    woozy out of it for more than a day previously   Depression    Endometriosis    Mental disorder    Depression   Sleep apnea     Past Surgical History:  Procedure Laterality Date   ANTERIOR CRUCIATE LIGAMENT REPAIR     right x 2, left x 1   LAPAROSCOPY      Social History   Socioeconomic History   Marital status: Married    Spouse name: Anda Kraft   Number of children: 0   Years of education: Master's Degree   Highest education level: Not on file  Occupational History   Not on file  Tobacco Use   Smoking status: Never   Smokeless tobacco: Never  Vaping Use   Vaping Use: Never used  Substance and Sexual Activity   Alcohol use: Yes    Comment: rarely   Drug use: Never   Sexual activity: Yes    Comment: female partners  Other Topics Concern   Not on file  Social History Narrative   09/11/20   From: [place] ([place]iginally [place]) - moved to be on the [place] near family   Living: with wife, Anda Kraft (2014) - but together 2001   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 - [place]      Family: parents in [place], in-laws in [place]      Enjoys: watching sports, going to sporting events, paint rocks      Exercise: not currently, knee injury July 2020   Diet: not great      Safety   Seat belts: Yes     Guns: No   Safe in relationships: Yes    Social Determinants of Radio broadcast assistant Strain: Not on file  Food Insecurity: Not on file  Transportation Needs: Not on file  Physical Activity: Not on file  Stress: Not on file  Social Connections: Not on file  Intimate Partner Violence: Not on file    Current Outpatient Medications on File Prior to Visit  Medication Sig Dispense Refill   acetaminophen (TYLENOL) 500 MG tablet Take 1,000 mg by mouth every 6 (six) hours as needed for moderate pain or headache.     albuterol (VENTOLIN HFA) 108 (90 Base) MCG/ACT inhaler Inhale 2 puffs into the lungs every 6 (six) hours as needed for wheezing or shortness of breath.     Armodafinil 150 MG tablet Take 150 mg by mouth every morning.     b complex vitamins capsule Take 1 capsule by mouth daily.     buPROPion (WELLBUTRIN XL) 150 MG 24 hr tablet TAKE 2 TABLETS (300 MG TOTAL) BY MOUTH DAILY. 180 tablet 3   Carboxymethylcellul-Glycerin (LUBRICATING EYE DROPS OP) Place 1 drop into both  eyes daily as needed (dry eyes).     CHARCOAL PO Take 1 tablet by mouth daily as needed (gas).     Cholecalciferol (VITAMIN D) 50 MCG (2000 UT) tablet Take 2,000 Units by mouth daily.     Ferrous Sulfate (IRON PO) Take 1 tablet by mouth daily.     FLUoxetine (PROZAC) 40 MG capsule Take 2 capsules (80 mg total) by mouth daily. 2 40mg  Tablets (Patient taking differently: Take 80 mg by mouth daily.) 180 capsule 3   ibuprofen (ADVIL) 200 MG tablet Take 400 mg by mouth every 6 (six) hours as needed for headache or moderate pain.     montelukast (SINGULAIR) 10 MG tablet Take 1 tablet (10 mg total) by mouth at bedtime. 90 tablet 1   norethindrone (AYGESTIN) 5 MG tablet TAKE 1 TABLET BY MOUTH EVERY DAY 90 tablet 3   No current facility-administered medications on file prior to visit.    No Known Allergies  Family History  Problem Relation Age of Onset   Hypertension Mother    Arthritis Mother    Aneurysm Paternal  Grandfather        stomach - smoker   Aneurysm Paternal Aunt        cardiac - non-smoker   Polycystic ovary syndrome Neg Hx     BP 126/80 (BP Location: Right Arm, Patient Position: Sitting, Cuff Size: Large)   Pulse 95   Ht 5\' 6"  (1.676 m)   Wt 279 lb 9.6 oz (126.8 kg)   SpO2 97%   BMI 45.13 kg/m    Review of Systems Denies galactorrhea, sob, and hair loss.  Depression is well-controlled.      Objective:   Physical Exam VS: see vs page GEN: no distress HEAD: head: no deformity eyes: no periorbital swelling, no proptosis.   external nose and ears are normal NECK: supple, thyroid is not enlarged CHEST WALL: no deformity LUNGS: clear to auscultation CV: reg rate and rhythm, no murmur.  MUSCULOSKELETAL: gait is normal and steady.  EXTEMITIES: no deformity.  Trace bilat leg edema.  NEURO:  readily moves all 4's.  sensation is intact to touch on all 4's.   SKIN:  Normal texture and temperature.  No rash or suspicious lesion is visible.  Mild terminal hair and acne on the face.  NODES:  None palpable at the neck PSYCH: alert, well-oriented.  Does not appear anxious nor depressed.     [place] (2022): normal  Lab Results  Component Value Date   TSH 1.210 08/28/2021   FSH=13  Lab Results  Component Value Date   WBC 7.7 06/26/2021   HGB 13.3 06/26/2021   HCT 39.6 06/26/2021   MCV 88.8 06/26/2021   PLT 311.0 06/26/2021   I have reviewed outside records, and summarized: Pt was noted to have elevated testosterone, and referred here.  She was seen for GYN wellness,a dn general health was good.      Assessment & Plan:  PCO, new to me, uncontrolled.  Weight gain, assoc with the above.  Patient Instructions  Let's check a 24HR urine collection for cortisol.   After you are finished with that, you should do a "dexamethasone suppression test."  For this, you would take dexamethasone 1 mg at 10 pm (I have sent a prescription to your pharmacy), then come in for a "cortisol" blood  test the next morning before 9 am.  You do not need to be fasting for this test.  We'll check the A1c at the  same time.   Also, I have sent a prescription to your pharmacy, for a pill to reduce the effect of testosterone.  Please come back for a follow-up appointment in 3 months.     Bariatric Surgery You have so much to gain by losing weight.  You may have already tried every diet and exercise plan imaginable.  And, you may have sought advice from your family physician, too.   Sometimes, in spite of such diligent efforts, you may not be able to achieve long-term results by yourself.  In cases of severe obesity, bariatric or weight loss surgery is a proven method of achieving long-term weight control.  Our Services Our bariatric surgery programs offer our patients new hope and long-term weight-loss solution.  Since introducing our services in 2003, we have conducted more than 2,400 successful procedures.  Our program is designated as a Programmer, multimedia by the Metabolic and Bariatric Surgery Accreditation and Quality Improvement Program (MBSAQIP), a IT trainer that sets rigorous patient safety and outcome standards.  Our program is also designated as a Ecologist by [employer].   Our exceptional weight-loss surgery team specializes in diagnosis, treatment, follow-up care, and ongoing support for our patients with severe weight loss challenges.  We currently offer laparoscopic sleeve gastrectomy, gastric bypass, and adjustable gastric band (LAP-BAND).    Attend our [place] Choosing to undergo a bariatric procedure is a big decision, and one that should not be taken lightly.  You now have two options in how you learn about weight-loss surgery - in person or online.  Our objective is to ensure you have all of the information that you need to evaluate the advantages and obligations of this life changing procedure.  Please note that you are not alone in  this process, and our experienced team is ready to assist and answer all of your questions.  There are several ways to register for a seminar (either on-line or in person):  Call [phone] Go on-line to [facility] and register for either type of seminar.  [email or web address]

## 2021-11-01 ENCOUNTER — Other Ambulatory Visit: Payer: Self-pay

## 2021-11-01 ENCOUNTER — Other Ambulatory Visit: Payer: 59

## 2021-11-01 DIAGNOSIS — R635 Abnormal weight gain: Secondary | ICD-10-CM

## 2021-11-02 ENCOUNTER — Other Ambulatory Visit (INDEPENDENT_AMBULATORY_CARE_PROVIDER_SITE_OTHER): Payer: 59

## 2021-11-02 DIAGNOSIS — R635 Abnormal weight gain: Secondary | ICD-10-CM | POA: Diagnosis not present

## 2021-11-02 DIAGNOSIS — R739 Hyperglycemia, unspecified: Secondary | ICD-10-CM | POA: Diagnosis not present

## 2021-11-02 LAB — CORTISOL: Cortisol, Plasma: 0.5 ug/dL

## 2021-11-02 LAB — HEMOGLOBIN A1C: Hgb A1c MFr Bld: 5.5 % (ref 4.6–6.5)

## 2021-11-06 LAB — CORTISOL, URINE, 24 HOUR
24 Hour urine volume (VMAHVA): 1400 mL
CREATININE, URINE: 1.52 g/(24.h) (ref 0.50–2.15)
Cortisol (Ur), Free: 29.6 mcg/24 h (ref 4.0–50.0)

## 2021-12-03 ENCOUNTER — Encounter: Payer: Self-pay | Admitting: Family Medicine

## 2021-12-05 ENCOUNTER — Other Ambulatory Visit: Payer: Self-pay | Admitting: Family Medicine

## 2021-12-05 DIAGNOSIS — J4520 Mild intermittent asthma, uncomplicated: Secondary | ICD-10-CM

## 2021-12-11 ENCOUNTER — Ambulatory Visit: Payer: 59 | Admitting: Family

## 2021-12-12 ENCOUNTER — Encounter: Payer: Self-pay | Admitting: Family Medicine

## 2021-12-12 ENCOUNTER — Other Ambulatory Visit: Payer: Self-pay

## 2021-12-12 ENCOUNTER — Ambulatory Visit (INDEPENDENT_AMBULATORY_CARE_PROVIDER_SITE_OTHER): Payer: 59 | Admitting: Family Medicine

## 2021-12-12 VITALS — BP 126/74 | HR 87 | Temp 97.7°F | Ht 66.0 in | Wt 282.4 lb

## 2021-12-12 DIAGNOSIS — D1800 Hemangioma unspecified site: Secondary | ICD-10-CM | POA: Insufficient documentation

## 2021-12-12 DIAGNOSIS — D509 Iron deficiency anemia, unspecified: Secondary | ICD-10-CM | POA: Diagnosis not present

## 2021-12-12 DIAGNOSIS — D1801 Hemangioma of skin and subcutaneous tissue: Secondary | ICD-10-CM

## 2021-12-12 DIAGNOSIS — D649 Anemia, unspecified: Secondary | ICD-10-CM | POA: Insufficient documentation

## 2021-12-12 DIAGNOSIS — R5383 Other fatigue: Secondary | ICD-10-CM

## 2021-12-12 LAB — CBC WITH DIFFERENTIAL/PLATELET
Basophils Absolute: 0 10*3/uL (ref 0.0–0.1)
Basophils Relative: 0.6 % (ref 0.0–3.0)
Eosinophils Absolute: 0.1 10*3/uL (ref 0.0–0.7)
Eosinophils Relative: 2.1 % (ref 0.0–5.0)
HCT: 39.7 % (ref 36.0–46.0)
Hemoglobin: 13 g/dL (ref 12.0–15.0)
Lymphocytes Relative: 13.4 % (ref 12.0–46.0)
Lymphs Abs: 0.9 10*3/uL (ref 0.7–4.0)
MCHC: 32.7 g/dL (ref 30.0–36.0)
MCV: 91.5 fl (ref 78.0–100.0)
Monocytes Absolute: 0.4 10*3/uL (ref 0.1–1.0)
Monocytes Relative: 5.5 % (ref 3.0–12.0)
Neutro Abs: 5.3 10*3/uL (ref 1.4–7.7)
Neutrophils Relative %: 78.4 % — ABNORMAL HIGH (ref 43.0–77.0)
Platelets: 306 10*3/uL (ref 150.0–400.0)
RBC: 4.34 Mil/uL (ref 3.87–5.11)
RDW: 14.4 % (ref 11.5–15.5)
WBC: 6.8 10*3/uL (ref 4.0–10.5)

## 2021-12-12 LAB — IRON: Iron: 93 ug/dL (ref 42–145)

## 2021-12-12 LAB — FERRITIN: Ferritin: 60.4 ng/mL (ref 10.0–291.0)

## 2021-12-12 MED ORDER — ALBUTEROL SULFATE HFA 108 (90 BASE) MCG/ACT IN AERS: 2.0000 | INHALATION_SPRAY | Freq: Four times a day (QID) | RESPIRATORY_TRACT | 1 refills | Status: AC | PRN

## 2021-12-12 NOTE — Assessment & Plan Note (Signed)
With more fatigue lately  Takes iron every other day (with vit C)  Cbc and iron and ferritin ordered

## 2021-12-12 NOTE — Patient Instructions (Addendum)
Labs today  Take care of yourself   Keep up the new exercise  Eat well and drink water   Watch spot on leg, don't shave over it

## 2021-12-12 NOTE — Assessment & Plan Note (Signed)
May play into her fatigue  Encouraged work on healthy diet and exercise

## 2021-12-12 NOTE — Progress Notes (Signed)
Subjective:    Patient ID: Maria Clark, female    DOB: [birth year], 50 y.o.   MRN: [id number]  ,This visit occurred during the SARS-CoV-2 public health emergency.  Safety protocols were in place, including screening questions prior to the visit, additional usage of staff PPE, and extensive cleaning of exam room while observing appropriate contact time as indicated for disinfecting solutions.   HPI 50 yo pt of Dr Einar Pheasant presents for fatigue , inhaler, knee problem  Wt Readings from Last 3 Encounters:  12/12/21 282 lb 6 oz (128.1 kg)  10/24/21 279 lb 9.6 oz (126.8 kg)  07/11/21 266 lb (120.7 kg)   45.58 kg/m  She has h/o OSA and RLS and morbid obesity as well as endometriosis and HTN  Supposed to use bipap-now uses it regularly  Still wakes up tired  RLS is better   Spot on R leg near knee Red  Raised  Shaves over it and bleeds a lot   BP Readings from Last 3 Encounters:  12/12/21 126/74  10/24/21 126/80  07/11/21 137/86     Lab Results  Component Value Date   WBC 7.7 06/26/2021   HGB 13.3 06/26/2021   HCT 39.6 06/26/2021   MCV 88.8 06/26/2021   PLT 311.0 06/26/2021   Lab Results  Component Value Date   FERRITIN 45.4 06/26/2021   Takes iron every other day with vit C Lab Results  Component Value Date   [id number] 06/26/2021   Takes wellbutrin and prozac for depression  ? Narcolepsy  Considering weight loss surgery   Cortisol level was nl in November Glucose 87  FSH -not menopausal Gyn Dr Kennon Rounds is watching hormone levels and testosterone/also endocrinology   She saw endocrinology for hyperglycemia and wt gain  Lab Results  Component Value Date   CREATININE 0.78 06/26/2021   BUN 12 06/26/2021   NA 138 06/26/2021   K 4.4 06/26/2021   CL 103 06/26/2021   CO2 27 06/26/2021   Lab Results  Component Value Date   HGBA1C 5.5 11/02/2021   Lab Results  Component Value Date   TSH 1.210 08/28/2021     Takes progesterone daily (for  uterine lining)  No period in months  A little spotting over the weekend   Also on aldactone  Has acne and hair growth   Has an appt with a functional doctor today- to discuss poss things that may keep her from feeling good  ? Leaky gut   Starting the just dance videos   Has issues with EBV   Needs refill of albuterol for asthma   Patient Active Problem List   Diagnosis Date Noted   Anemia 12/12/2021   Angioma 12/12/2021   Weight gain 10/24/2021   Hyperglycemia 10/24/2021   Hyperlipidemia 06/26/2021   Other fatigue 06/26/2021   Morbid obesity ([place]) 12/06/2020   Chronic headaches 09/11/2020   Restless legs 09/11/2020   Asthma 09/11/2020   Essential hypertension 09/11/2020   OSA treated with BiPAP 09/11/2020   Endometriosis 06/28/2020   Major depressive disorder, recurrent episode, in full remission ([place]) 06/28/2020   Nephrolithiasis 06/28/2020   Past Medical History:  Diagnosis Date   Anemia    Asthma    Complication of anesthesia    woozy out of it for more than a day previously   Depression    Endometriosis    Mental disorder    Depression   Sleep apnea    Past Surgical History:  Procedure Laterality Date  ANTERIOR CRUCIATE LIGAMENT REPAIR     right x 2, left x 1   LAPAROSCOPY     Social History   Tobacco Use   Smoking status: Never   Smokeless tobacco: Never  Vaping Use   Vaping Use: Never used  Substance Use Topics   Alcohol use: Yes    Comment: rarely   Drug use: Never   Family History  Problem Relation Age of Onset   Hypertension Mother    Arthritis Mother    Aneurysm Paternal Grandfather        stomach - smoker   Aneurysm Paternal Aunt        cardiac - non-smoker   Polycystic ovary syndrome Neg Hx    No Known Allergies Current Outpatient Medications on File Prior to Visit  Medication Sig Dispense Refill   acetaminophen (TYLENOL) 500 MG tablet Take 1,000 mg by mouth every 6 (six) hours as needed for moderate pain or headache.      Armodafinil 150 MG tablet Take 150 mg by mouth every morning.     b complex vitamins capsule Take 1 capsule by mouth daily.     buPROPion (WELLBUTRIN XL) 150 MG 24 hr tablet TAKE 2 TABLETS (300 MG TOTAL) BY MOUTH DAILY. 180 tablet 3   Carboxymethylcellul-Glycerin (LUBRICATING EYE DROPS OP) Place 1 drop into both eyes daily as needed (dry eyes).     Cholecalciferol (VITAMIN D) 50 MCG (2000 UT) tablet Take 2,000 Units by mouth daily.     Ferrous Sulfate (IRON PO) Take 1 tablet by mouth daily.     FLUoxetine (PROZAC) 40 MG capsule Take 2 capsules (80 mg total) by mouth daily. 2 40mg  Tablets (Patient taking differently: Take 80 mg by mouth daily.) 180 capsule 3   ibuprofen (ADVIL) 200 MG tablet Take 400 mg by mouth every 6 (six) hours as needed for headache or moderate pain.     montelukast (SINGULAIR) 10 MG tablet TAKE 1 TABLET BY MOUTH EVERYDAY AT BEDTIME 90 tablet 3   norethindrone (AYGESTIN) 5 MG tablet TAKE 1 TABLET BY MOUTH EVERY DAY 90 tablet 3   spironolactone (ALDACTONE) 50 MG tablet Take 1 tablet (50 mg total) by mouth daily. 90 tablet 3   No current facility-administered medications on file prior to visit.     Review of Systems  Constitutional:  Positive for fatigue. Negative for activity change, appetite change, fever and unexpected weight change.  HENT:  Negative for congestion, ear pain, rhinorrhea, sinus pressure and sore throat.   Eyes:  Negative for pain, redness and visual disturbance.  Respiratory:  Negative for cough, shortness of breath and wheezing.   Cardiovascular:  Negative for chest pain and palpitations.  Gastrointestinal:  Negative for abdominal pain, blood in stool, constipation and diarrhea.  Endocrine: Negative for polydipsia and polyuria.  Genitourinary:  Positive for menstrual problem. Negative for dysuria, frequency, pelvic pain and urgency.       H/o endometriosis   Musculoskeletal:  Negative for arthralgias, back pain and myalgias.  Skin:  Negative for pallor  and rash.       Acne and hair growth  Allergic/Immunologic: Negative for environmental allergies.  Neurological:  Negative for dizziness, syncope and headaches.  Hematological:  Negative for adenopathy. Does not bruise/bleed easily.  Psychiatric/Behavioral:  Negative for decreased concentration and dysphoric mood. The patient is not nervous/anxious.       Objective:   Physical Exam Constitutional:      General: She is not in acute distress.  Appearance: Normal appearance. She is well-developed. She is obese. She is not ill-appearing or diaphoretic.  HENT:     Head: Normocephalic and atraumatic.     Right Ear: Tympanic membrane and ear canal normal.     Left Ear: Tympanic membrane and ear canal normal.     Mouth/Throat:     Mouth: Mucous membranes are moist.     Pharynx: Oropharynx is clear.  Eyes:     General: No scleral icterus.    Conjunctiva/sclera: Conjunctivae normal.     Pupils: Pupils are equal, round, and reactive to light.  Neck:     Thyroid: No thyromegaly.     Vascular: No carotid bruit or JVD.  Cardiovascular:     Rate and Rhythm: Normal rate and regular rhythm.     Heart sounds: Normal heart sounds.    No gallop.  Pulmonary:     Effort: Pulmonary effort is normal. No respiratory distress.     Breath sounds: Normal breath sounds. No wheezing or rales.  Abdominal:     General: Bowel sounds are normal. There is no distension or abdominal bruit.     Palpations: Abdomen is soft. There is no mass.     Tenderness: There is no abdominal tenderness. There is no right CVA tenderness or left CVA tenderness.  Musculoskeletal:     Cervical back: Normal range of motion and neck supple. No tenderness.     Right lower leg: No edema.     Left lower leg: No edema.  Lymphadenopathy:     Cervical: No cervical adenopathy.  Skin:    General: Skin is warm and dry.     Coloration: Skin is not pale.     Findings: No rash.     Comments: 2-4 mm angioma on L lower leg below knee     Neurological:     Mental Status: She is alert.     Coordination: Coordination normal.     Deep Tendon Reflexes: Reflexes are normal and symmetric. Reflexes normal.  Psychiatric:        Mood and Affect: Mood normal.        Cognition and Memory: Cognition and memory normal.     Comments: Mood is normal           Assessment & Plan:   Problem List Items Addressed This Visit       Other   Morbid obesity ([place])    May play into her fatigue  Encouraged work on healthy diet and exercise        Other fatigue - Primary    Reviewed chart notes, labs and specialist corresp today  Multifactorial with causes including obesity, hormone change, OSA, polypharmacy and past iron def anemia , and depression  H/o high testosterone-unsure of significance (no doubt adds to obesity)  Most likely perimenopausal -may be a big player in this At pt req- cbc and iron and ferritin checked  Encouraged good health habits with some exercise        Relevant Orders   CBC with Differential/Platelet (Completed)   Iron (Completed)   Ferritin (Completed)   Anemia    With more fatigue lately  Takes iron every other day (with vit C)  Cbc and iron and ferritin ordered       Relevant Orders   CBC with Differential/Platelet (Completed)   Iron (Completed)   Ferritin (Completed)   Angioma    Small 2-4 mm on L lower leg Reassurance given Monitor  Try not to  shave over

## 2021-12-12 NOTE — Assessment & Plan Note (Signed)
Reviewed chart notes, labs and specialist corresp today  Multifactorial with causes including obesity, hormone change, OSA, polypharmacy and past iron def anemia , and depression  H/o high testosterone-unsure of significance (no doubt adds to obesity)  Most likely perimenopausal -may be a big player in this At pt req- cbc and iron and ferritin checked  Encouraged good health habits with some exercise

## 2021-12-12 NOTE — Assessment & Plan Note (Signed)
Small 2-4 mm on L lower leg Reassurance given Monitor  Try not to shave over

## 2021-12-16 ENCOUNTER — Ambulatory Visit (HOSPITAL_COMMUNITY)
Admission: EM | Admit: 2021-12-16 | Discharge: 2021-12-16 | Disposition: A | Discharge status: Home / Self Care | Payer: 59 | Attending: Student | Admitting: Student

## 2021-12-16 ENCOUNTER — Other Ambulatory Visit: Payer: Self-pay

## 2021-12-16 ENCOUNTER — Encounter (HOSPITAL_COMMUNITY): Payer: Self-pay | Admitting: Emergency Medicine

## 2021-12-16 DIAGNOSIS — J4521 Mild intermittent asthma with (acute) exacerbation: Secondary | ICD-10-CM

## 2021-12-16 DIAGNOSIS — J208 Acute bronchitis due to other specified organisms: Secondary | ICD-10-CM

## 2021-12-16 MED ORDER — ALBUTEROL SULFATE (2.5 MG/3ML) 0.083% IN NEBU
2.5000 mg | INHALATION_SOLUTION | Freq: Once | RESPIRATORY_TRACT | Status: AC
  Administered 2021-12-16: 2.5 mg via RESPIRATORY_TRACT

## 2021-12-16 MED ORDER — ALBUTEROL SULFATE (2.5 MG/3ML) 0.083% IN NEBU
INHALATION_SOLUTION | RESPIRATORY_TRACT | Status: AC
  Filled 2021-12-16: qty 3

## 2021-12-16 MED ORDER — PROMETHAZINE-DM 6.25-15 MG/5ML PO SYRP: 2.5000 mL | ORAL_SOLUTION | Freq: Four times a day (QID) | ORAL | 0 refills | Status: DC | PRN

## 2021-12-16 MED ORDER — PREDNISONE 20 MG PO TABS: 40.0000 mg | ORAL_TABLET | Freq: Every day | ORAL | 0 refills | Status: AC

## 2021-12-16 NOTE — ED Triage Notes (Signed)
12/12/2021 patient started having symptoms .  Patient has a cough, sob, wheezing, sore throat, runny nose and headache.  Covid home tests x 2 and both negative.   Patient is not getting much relief from inhaler

## 2021-12-16 NOTE — ED Provider Notes (Addendum)
[facility]    CSN: [id number] Arrival date & time: 12/16/21  1157      History   Chief Complaint Chief Complaint  Patient presents with   Sore Throat    HPI Maria Clark is a 51 y.o. female presenting with viral syndrome. Medical history asthma typically controlled on albuterol inhaler.  This patient was seen by her primary care on 12/12/2021, at that time she was COVID negative per home test.   Symptoms since 12/28. Endorses continued cough, wheezing x4 days, DOE particularly stairs. sore throat, nasal congestion, headaches.  Cough is productive but she is swallowing this, does not know color. Albuterol inhaler provides relief, but this is short-lived. Using this 6x daily, typically only requires as needed. Has tried Mucinex, Dayquil, etc. Denies CP, dizziness, fever/chills, n/v/d/c/abd pain.  HPI  Past Medical History:  Diagnosis Date   Anemia    Asthma    Complication of anesthesia    woozy out of it for more than a day previously   Depression    Endometriosis    Mental disorder    Depression   Sleep apnea     Patient Active Problem List   Diagnosis Date Noted   Anemia 12/12/2021   Angioma 12/12/2021   Weight gain 10/24/2021   Hyperglycemia 10/24/2021   Hyperlipidemia 06/26/2021   Other fatigue 06/26/2021   Morbid obesity ([place]) 12/06/2020   Chronic headaches 09/11/2020   Restless legs 09/11/2020   Asthma 09/11/2020   Essential hypertension 09/11/2020   OSA treated with BiPAP 09/11/2020   Endometriosis 06/28/2020   Major depressive disorder, recurrent episode, in full remission ([place]) 06/28/2020   Nephrolithiasis 06/28/2020    Past Surgical History:  Procedure Laterality Date   ANTERIOR CRUCIATE LIGAMENT REPAIR     right x 2, left x 1   LAPAROSCOPY      OB History     Gravida  0   Para  0   Term  0   Preterm  0   AB  0   Living  0      SAB  0   IAB  0   Ectopic  0   Multiple  0   Live Births  0             Home Medications    Prior to Admission medications   Medication Sig Start Date End Date Taking? Authorizing Provider  predniSONE (DELTASONE) 20 MG tablet Take 2 tablets (40 mg total) by mouth daily for 5 days. Take with breakfast or lunch. Avoid NSAIDs (ibuprofen, etc) while taking this medication. 12/16/21 12/21/21 Yes Hazel Sams, PA-C  promethazine-dextromethorphan (PROMETHAZINE-DM) 6.25-15 MG/5ML syrup Take 2.5 mLs by mouth 4 (four) times daily as needed for cough. 12/16/21  Yes Hazel Sams, PA-C  acetaminophen (TYLENOL) 500 MG tablet Take 1,000 mg by mouth every 6 (six) hours as needed for moderate pain or headache.    [provider]  albuterol (VENTOLIN HFA) 108 (90 Base) MCG/ACT inhaler Inhale 2 puffs into the lungs every 6 (six) hours as needed for wheezing or shortness of breath. 12/12/21   Tower, Wynelle Fanny, MD  Armodafinil 150 MG tablet Take 150 mg by mouth every morning. 06/06/21   [provider]  b complex vitamins capsule Take 1 capsule by mouth daily.    [provider]  buPROPion (WELLBUTRIN XL) 150 MG 24 hr tablet TAKE 2 TABLETS (300 MG TOTAL) BY MOUTH DAILY. 08/06/21   Donnamae Jude,  MD  Carboxymethylcellul-Glycerin (LUBRICATING EYE DROPS OP) Place 1 drop into both eyes daily as needed (dry eyes).    [provider]  Cholecalciferol (VITAMIN D) 50 MCG (2000 UT) tablet Take 2,000 Units by mouth daily.    [provider]  Ferrous Sulfate (IRON PO) Take 1 tablet by mouth daily.    [provider]  FLUoxetine (PROZAC) 40 MG capsule Take 2 capsules (80 mg total) by mouth daily. 2 40mg  Tablets Patient taking differently: Take 80 mg by mouth daily. 06/28/20   Donnamae Jude, MD  ibuprofen (ADVIL) 200 MG tablet Take 400 mg by mouth every 6 (six) hours as needed for headache or moderate pain.    [provider]  montelukast (SINGULAIR) 10 MG tablet TAKE 1 TABLET BY MOUTH EVERYDAY AT BEDTIME 12/07/21   Lesleigh Noe, MD   norethindrone (AYGESTIN) 5 MG tablet TAKE 1 TABLET BY MOUTH EVERY DAY 07/05/21   Truett Mainland, DO  spironolactone (ALDACTONE) 50 MG tablet Take 1 tablet (50 mg total) by mouth daily. 10/24/21   Renato Shin, MD    Family History Family History  Problem Relation Age of Onset   Hypertension Mother    Arthritis Mother    Aneurysm Paternal Grandfather        stomach - smoker   Aneurysm Paternal Aunt        cardiac - non-smoker   Polycystic ovary syndrome Neg Hx     Social History Social History   Tobacco Use   Smoking status: Never   Smokeless tobacco: Never  Vaping Use   Vaping Use: Never used  Substance Use Topics   Alcohol use: Yes    Comment: rarely   Drug use: Never     Allergies   Patient has no known allergies.   Review of Systems Review of Systems  Constitutional:  Positive for fatigue. Negative for appetite change, chills and fever.  HENT:  Positive for congestion. Negative for ear pain, rhinorrhea, sinus pressure, sinus pain and sore throat.   Eyes:  Negative for redness and visual disturbance.  Respiratory:  Positive for cough and wheezing. Negative for chest tightness and shortness of breath.   Cardiovascular:  Negative for chest pain and palpitations.  Gastrointestinal:  Negative for abdominal pain, constipation, diarrhea, nausea and vomiting.  Genitourinary:  Negative for dysuria, frequency and urgency.  Musculoskeletal:  Negative for myalgias.  Neurological:  Negative for dizziness, weakness and headaches.  Psychiatric/Behavioral:  Negative for confusion.   All other systems reviewed and are negative.   Physical Exam Triage Vital Signs ED Triage Vitals  Enc Vitals Group     BP      Pulse      Resp      Temp      Temp src      SpO2      Weight      Height      Head Circumference      Peak Flow      Pain Score      Pain Loc      Pain Edu?      Excl. in [place]?    No data found.  Updated Vital Signs BP 124/61 (BP Location: Right Arm)  Comment (BP Location): large cuff, arm at heart level   Pulse 96    Temp 98.1 F (36.7 C) (Oral)    Resp (!) 22    SpO2 97%   Visual Acuity Right Eye Distance:   Left  Eye Distance:   Bilateral Distance:    Right Eye Near:   Left Eye Near:    Bilateral Near:     Physical Exam Vitals reviewed.  Constitutional:      General: She is not in acute distress.    Appearance: Normal appearance. She is obese. She is not ill-appearing.  HENT:     Head: Normocephalic and atraumatic.     Right Ear: Tympanic membrane, ear canal and external ear normal. No tenderness. No middle ear effusion. There is no impacted cerumen. Tympanic membrane is not perforated, erythematous, retracted or bulging.     Left Ear: Tympanic membrane, ear canal and external ear normal. No tenderness.  No middle ear effusion. There is no impacted cerumen. Tympanic membrane is not perforated, erythematous, retracted or bulging.     Nose: Nose normal. No congestion.     Mouth/Throat:     Mouth: Mucous membranes are moist.     Pharynx: Uvula midline. No oropharyngeal exudate or posterior oropharyngeal erythema.  Eyes:     Extraocular Movements: Extraocular movements intact.     Pupils: Pupils are equal, round, and reactive to light.  Cardiovascular:     Rate and Rhythm: Normal rate and regular rhythm.     Heart sounds: Normal heart sounds.  Pulmonary:     Effort: Pulmonary effort is normal.     Breath sounds: Wheezing present. No decreased breath sounds, rhonchi or rales.     Comments: Freq cough. Expiratory wheezes throughout. Improved following albuterol nebulizer.  Abdominal:     Palpations: Abdomen is soft.     Tenderness: There is no abdominal tenderness. There is no guarding or rebound.  Lymphadenopathy:     Cervical: No cervical adenopathy.     Right cervical: No superficial cervical adenopathy.    Left cervical: No superficial cervical adenopathy.  Neurological:     General: No focal deficit present.     Mental  Status: She is alert and oriented to person, place, and time.  Psychiatric:        Mood and Affect: Mood normal.        Behavior: Behavior normal.        Thought Content: Thought content normal.        Judgment: Judgment normal.     UC Treatments / Results  Labs (all labs ordered are listed, but only abnormal results are displayed) Labs Reviewed - No data to display  EKG   Radiology No results found.  Procedures Procedures (including critical care time)  Medications Ordered in UC Medications  albuterol (PROVENTIL) (2.5 MG/3ML) 0.083% nebulizer solution 2.5 mg (has no administration in time range)    Initial Impression / Assessment and Plan / UC Course  I have reviewed the triage vital signs and the nursing notes.  Pertinent labs & imaging results that were available during my care of the patient were reviewed by me and considered in my medical decision making (see chart for details).     This patient is a very pleasant 51 y.o. year old female presenting with asthma exacerbation / bronchitis following viral URI x5 days. Afebrile, nontachy.  Borderline tachypneic.  Wheezes throughout, improved following albuterol nebulizer during visit. Low concern for pneumonia at today's visit, but discussed signs and symptoms of this/secondary sickening.  Covid negative per home test x2. Today is day 5. Rapid influenza deferred. She is out of tamiflu window.  A1c 5.5 on 11/02/21.   Prednisone, promethazine DM sent. Continue albuterol inhaler.  ED return  precautions discussed. Patient verbalizes understanding and agreement.   Coding Level 4 for acute illness with systemic symptoms; exacerbation chronic condition; and prescription drug management   Final Clinical Impressions(s) / UC Diagnoses   Final diagnoses:  Viral bronchitis  Mild intermittent asthma with acute exacerbation     Discharge Instructions      -You have bronchitis. Bronchitis is an inflammation of the lining  of your bronchial tubes, which carry air to and from your lungs. This is likely related to asthma/ viral URI. This typically occurs after a virus, like a cold virus. People who have bronchitis often cough up thickened mucus, which can be discolored. This isn't a bacterial infection, so you don't need antibiotics. We treat it with medications to help reduce inflammation and open up your lungs.  -Prednisone, 2 pills taken at the same time for 5 days in a row.  Try taking this earlier in the day as it can give you energy. Avoid NSAIDs like ibuprofen and alleve while taking this medication as they can increase your risk of stomach upset and even GI bleeding when in combination with a steroid. You can continue tylenol (acetaminophen) up to 1000mg  3x daily. -Promethazine DM cough syrup for congestion/cough. This could make you drowsy, so take at night before bed.      ED Prescriptions     Medication Sig Dispense Auth. Provider   predniSONE (DELTASONE) 20 MG tablet Take 2 tablets (40 mg total) by mouth daily for 5 days. Take with breakfast or lunch. Avoid NSAIDs (ibuprofen, etc) while taking this medication. 10 tablet Hazel Sams, PA-C   promethazine-dextromethorphan (PROMETHAZINE-DM) 6.25-15 MG/5ML syrup Take 2.5 mLs by mouth 4 (four) times daily as needed for cough. 60 mL Hazel Sams, PA-C      PDMP not reviewed this encounter.   Hazel Sams, PA-C 12/16/21 1451    Hazel Sams, PA-C 12/16/21 1451

## 2021-12-16 NOTE — Discharge Instructions (Addendum)
-  You have bronchitis. Bronchitis is an inflammation of the lining of your bronchial tubes, which carry air to and from your lungs. This is likely related to asthma/ viral URI. This typically occurs after a virus, like a cold virus. People who have bronchitis often cough up thickened mucus, which can be discolored. This isn't a bacterial infection, so you don't need antibiotics. We treat it with medications to help reduce inflammation and open up your lungs.  -Prednisone, 2 pills taken at the same time for 5 days in a row.  Try taking this earlier in the day as it can give you energy. Avoid NSAIDs like ibuprofen and alleve while taking this medication as they can increase your risk of stomach upset and even GI bleeding when in combination with a steroid. You can continue tylenol (acetaminophen) up to 1000mg  3x daily. -Promethazine DM cough syrup for congestion/cough. This could make you drowsy, so take at night before bed.

## 2021-12-18 ENCOUNTER — Encounter: Payer: Self-pay | Admitting: Family Medicine

## 2021-12-20 ENCOUNTER — Ambulatory Visit (HOSPITAL_COMMUNITY)
Admission: EM | Admit: 2021-12-20 | Discharge: 2021-12-20 | Disposition: A | Discharge status: Home / Self Care | Payer: 59 | Attending: Student | Admitting: Student

## 2021-12-20 ENCOUNTER — Encounter (HOSPITAL_COMMUNITY): Payer: Self-pay | Admitting: Emergency Medicine

## 2021-12-20 ENCOUNTER — Other Ambulatory Visit: Payer: Self-pay

## 2021-12-20 ENCOUNTER — Ambulatory Visit (INDEPENDENT_AMBULATORY_CARE_PROVIDER_SITE_OTHER): Payer: 59

## 2021-12-20 DIAGNOSIS — R059 Cough, unspecified: Secondary | ICD-10-CM | POA: Diagnosis not present

## 2021-12-20 DIAGNOSIS — J208 Acute bronchitis due to other specified organisms: Secondary | ICD-10-CM

## 2021-12-20 DIAGNOSIS — J4521 Mild intermittent asthma with (acute) exacerbation: Secondary | ICD-10-CM | POA: Diagnosis not present

## 2021-12-20 DIAGNOSIS — R0602 Shortness of breath: Secondary | ICD-10-CM

## 2021-12-20 MED ORDER — QVAR REDIHALER 80 MCG/ACT IN AERB: 2.0000 | INHALATION_SPRAY | Freq: Two times a day (BID) | RESPIRATORY_TRACT | 0 refills | Status: AC

## 2021-12-20 NOTE — ED Triage Notes (Signed)
Seen 12/16/2021.  Patient had a virtual visit early this morning and they recommended a chest xray to make sure not walking pneumonia.    Cough has not improved.  Complains of fatigue.  Sore throat depends on amount of coughing

## 2021-12-20 NOTE — Discharge Instructions (Addendum)
-  Complete the albuterol, prednisone, tessalon, promethazine DM.  -Qvar inhaler 2 puffs twice daily while symptoms persist . -Follow-up with PCP if symptoms persist or worsen - new fevers/chills, shortness of breath worsening, new chest pain, coughing up dark sputum, etc.

## 2021-12-20 NOTE — ED Provider Notes (Signed)
[facility]    CSN: [id number] Arrival date & time: 12/20/21  6333      History   Chief Complaint Chief Complaint  Patient presents with   Cough    HPI Maria Clark is a 51 y.o. female presenting with follow-up for cough. I last saw this patient for bronchitis and asthma exacerbation 12/16/21 and managed with prednisone and promethazine DM, and continue home inhaler/ albuterol. Had a televisit last night - they sent tessalon, and recommended a CXR. Continued nonproductive cough. Occ SOB after coughing episodes. Using the albuterol inhaler 4x daily, not helpful. No fevers/chills. Cough syrup isn't providing relief. Hasn't picked up the tessalon yet. It looks like PCP planned to order a CXR on 12/18/21 but patient was not informed of this, and it has not been done.  HPI  Past Medical History:  Diagnosis Date   Anemia    Asthma    Complication of anesthesia    woozy out of it for more than a day previously   Depression    Endometriosis    Mental disorder    Depression   Sleep apnea     Patient Active Problem List   Diagnosis Date Noted   Anemia 12/12/2021   Angioma 12/12/2021   Weight gain 10/24/2021   Hyperglycemia 10/24/2021   Hyperlipidemia 06/26/2021   Other fatigue 06/26/2021   Morbid obesity ([place]) 12/06/2020   Chronic headaches 09/11/2020   Restless legs 09/11/2020   Asthma 09/11/2020   Essential hypertension 09/11/2020   OSA treated with BiPAP 09/11/2020   Endometriosis 06/28/2020   Major depressive disorder, recurrent episode, in full remission ([place]) 06/28/2020   Nephrolithiasis 06/28/2020    Past Surgical History:  Procedure Laterality Date   ANTERIOR CRUCIATE LIGAMENT REPAIR     right x 2, left x 1   LAPAROSCOPY      OB History     Gravida  0   Para  0   Term  0   Preterm  0   AB  0   Living  0      SAB  0   IAB  0   Ectopic  0   Multiple  0   Live Births  0            Home Medications    Prior to  Admission medications   Medication Sig Start Date End Date Taking? Authorizing Provider  beclomethasone (QVAR REDIHALER) 80 MCG/ACT inhaler Inhale 2 puffs into the lungs 2 (two) times daily for 7 days. 12/20/21 12/27/21 Yes Hazel Sams, PA-C  acetaminophen (TYLENOL) 500 MG tablet Take 1,000 mg by mouth every 6 (six) hours as needed for moderate pain or headache.    [provider]  albuterol (VENTOLIN HFA) 108 (90 Base) MCG/ACT inhaler Inhale 2 puffs into the lungs every 6 (six) hours as needed for wheezing or shortness of breath. 12/12/21   Tower, Wynelle Fanny, MD  Armodafinil 150 MG tablet Take 150 mg by mouth every morning. 06/06/21   [provider]  b complex vitamins capsule Take 1 capsule by mouth daily.    [provider]  buPROPion (WELLBUTRIN XL) 150 MG 24 hr tablet TAKE 2 TABLETS (300 MG TOTAL) BY MOUTH DAILY. 08/06/21   Donnamae Jude, MD  Carboxymethylcellul-Glycerin (LUBRICATING EYE DROPS OP) Place 1 drop into both eyes daily as needed (dry eyes).    [provider]  Cholecalciferol (VITAMIN D) 50 MCG (2000 UT) tablet Take 2,000 Units by  mouth daily.    [provider]  Ferrous Sulfate (IRON PO) Take 1 tablet by mouth daily.    [provider]  FLUoxetine (PROZAC) 40 MG capsule Take 2 capsules (80 mg total) by mouth daily. 2 40mg  Tablets Patient taking differently: Take 80 mg by mouth daily. 06/28/20   Donnamae Jude, MD  ibuprofen (ADVIL) 200 MG tablet Take 400 mg by mouth every 6 (six) hours as needed for headache or moderate pain.    [provider]  montelukast (SINGULAIR) 10 MG tablet TAKE 1 TABLET BY MOUTH EVERYDAY AT BEDTIME 12/07/21   Lesleigh Noe, MD  norethindrone (AYGESTIN) 5 MG tablet TAKE 1 TABLET BY MOUTH EVERY DAY 07/05/21   Truett Mainland, DO  predniSONE (DELTASONE) 20 MG tablet Take 2 tablets (40 mg total) by mouth daily for 5 days. Take with breakfast or lunch. Avoid NSAIDs (ibuprofen, etc) while taking this  medication. 12/16/21 12/21/21  Hazel Sams, PA-C  promethazine-dextromethorphan (PROMETHAZINE-DM) 6.25-15 MG/5ML syrup Take 2.5 mLs by mouth 4 (four) times daily as needed for cough. 12/16/21   Hazel Sams, PA-C  spironolactone (ALDACTONE) 50 MG tablet Take 1 tablet (50 mg total) by mouth daily. 10/24/21   Renato Shin, MD    Family History Family History  Problem Relation Age of Onset   Hypertension Mother    Arthritis Mother    Aneurysm Paternal Grandfather        stomach - smoker   Aneurysm Paternal Aunt        cardiac - non-smoker   Polycystic ovary syndrome Neg Hx     Social History Social History   Tobacco Use   Smoking status: Never    Passive exposure: Never   Smokeless tobacco: Never  Vaping Use   Vaping Use: Never used  Substance Use Topics   Alcohol use: Yes    Comment: rarely   Drug use: Never     Allergies   Patient has no known allergies.   Review of Systems Review of Systems  Constitutional:  Negative for appetite change, chills and fever.  HENT:  Positive for congestion. Negative for ear pain, rhinorrhea, sinus pressure, sinus pain and sore throat.   Eyes:  Negative for redness and visual disturbance.  Respiratory:  Positive for cough and shortness of breath. Negative for chest tightness and wheezing.   Cardiovascular:  Negative for chest pain and palpitations.  Gastrointestinal:  Negative for abdominal pain, constipation, diarrhea, nausea and vomiting.  Genitourinary:  Negative for dysuria, frequency and urgency.  Musculoskeletal:  Negative for myalgias.  Neurological:  Negative for dizziness, weakness and headaches.  Psychiatric/Behavioral:  Negative for confusion.   All other systems reviewed and are negative.   Physical Exam Triage Vital Signs ED Triage Vitals  Enc Vitals Group     BP      Pulse      Resp      Temp      Temp src      SpO2      Weight      Height      Head Circumference      Peak Flow      Pain Score      Pain  Loc      Pain Edu?      Excl. in [place]?    No data found.  Updated Vital Signs BP 132/80 (BP Location: Left Arm) Comment (BP Location): large cuff   Pulse 87    Temp  98.5 F (36.9 C) (Oral)    Resp 18 Comment: sitting, resting   SpO2 95%   Visual Acuity Right Eye Distance:   Left Eye Distance:   Bilateral Distance:    Right Eye Near:   Left Eye Near:    Bilateral Near:     Physical Exam Vitals reviewed.  Constitutional:      General: She is not in acute distress.    Appearance: Normal appearance. She is not ill-appearing.  HENT:     Head: Normocephalic and atraumatic.     Right Ear: Tympanic membrane, ear canal and external ear normal. No tenderness. No middle ear effusion. There is no impacted cerumen. Tympanic membrane is not perforated, erythematous, retracted or bulging.     Left Ear: Tympanic membrane, ear canal and external ear normal. No tenderness.  No middle ear effusion. There is no impacted cerumen. Tympanic membrane is not perforated, erythematous, retracted or bulging.     Nose: Nose normal. No congestion.     Mouth/Throat:     Mouth: Mucous membranes are moist.     Pharynx: Uvula midline. No oropharyngeal exudate or posterior oropharyngeal erythema.  Eyes:     Extraocular Movements: Extraocular movements intact.     Pupils: Pupils are equal, round, and reactive to light.  Cardiovascular:     Rate and Rhythm: Normal rate and regular rhythm.     Heart sounds: Normal heart sounds.  Pulmonary:     Effort: Pulmonary effort is normal.     Breath sounds: Decreased breath sounds and wheezing present. No rhonchi or rales.     Comments: Decreased breath sounds and expiratory wheezes throughout  Abdominal:     Palpations: Abdomen is soft.     Tenderness: There is no abdominal tenderness. There is no guarding or rebound.  Lymphadenopathy:     Cervical: No cervical adenopathy.     Right cervical: No superficial cervical adenopathy.    Left cervical: No superficial  cervical adenopathy.  Neurological:     General: No focal deficit present.     Mental Status: She is alert and oriented to person, place, and time.  Psychiatric:        Mood and Affect: Mood normal.        Behavior: Behavior normal.        Thought Content: Thought content normal.        Judgment: Judgment normal.     UC Treatments / Results  Labs (all labs ordered are listed, but only abnormal results are displayed) Labs Reviewed - No data to display  EKG   Radiology DG Chest 2 View  Result Date: 12/20/2021 CLINICAL DATA:  SHORTNESS OF BREATH AND COUGH. EXAM: CHEST - 2 VIEW COMPARISON:  None. FINDINGS: Cardiomediastinal silhouette is within normal limits. Lungs are well inflated. No focal consolidation or mass. No pleural effusion or pneumothorax. No acute displaced fracture. IMPRESSION: Normal chest. Electronically Signed   By: Michaelle Birks M.D.   On: 12/20/2021 08:47    Procedures Procedures (including critical care time)  Medications Ordered in UC Medications - No data to display  Initial Impression / Assessment and Plan / UC Course  I have reviewed the triage vital signs and the nursing notes.  Pertinent labs & imaging results that were available during my care of the patient were reviewed by me and considered in my medical decision making (see chart for details).  Clinical Course as of 12/20/21 0913  Thu Dec 20, 2021  0857 DG Chest 2  View [LG]    Clinical Course User Index [LG] Hazel Sams, PA-C    This patient is a very pleasant 51 y.o. year old female presenting with asthma exacerbation related to virus x8 days. Afebrile, nontachy. Expiratory wheezes throughout.   CXR checked given duration symptoms- negative.  Negative covid test x2 already. Breathing treatment performed 12/16/21 with minimal relief, she declines additional today.  Complete prednisone as directed. Continue albuterol, promethazine, tessalon. Also sent Qvar inhaler.   F/u with PCP if  symptoms persist.   ED return precautions discussed. Patient verbalizes understanding and agreement.   Coding Level 4 for acute exacerbation of chronic condition, and prescription drug management   Final Clinical Impressions(s) / UC Diagnoses   Final diagnoses:  Viral bronchitis  Mild intermittent asthma with acute exacerbation     Discharge Instructions      -Complete the albuterol, prednisone, tessalon, promethazine DM.  -Qvar inhaler 2 puffs twice daily while symptoms persist . -Follow-up with PCP if symptoms persist or worsen - new fevers/chills, shortness of breath worsening, new chest pain, coughing up dark sputum, etc.     ED Prescriptions     Medication Sig Dispense Auth. Provider   beclomethasone (QVAR REDIHALER) 80 MCG/ACT inhaler Inhale 2 puffs into the lungs 2 (two) times daily for 7 days. 1 each Hazel Sams, PA-C      PDMP not reviewed this encounter.   Hazel Sams, PA-C 12/20/21 [phone]

## 2021-12-21 LAB — BASIC METABOLIC PANEL
BUN: 14 (ref 4–21)
Chloride: 102 (ref 99–108)
Creatinine: 0.7 (ref 0.5–1.1)
Glucose: 127
Potassium: 5 mEq/L (ref 3.5–5.1)
Sodium: 138 (ref 137–147)
Sodium: 138 (ref 137–147)

## 2021-12-21 LAB — HEPATIC FUNCTION PANEL
ALT: 43 U/L — AB (ref 7–35)
AST: 19 (ref 13–35)
Alkaline Phosphatase: 145 — AB (ref 25–125)

## 2021-12-21 LAB — CBC AND DIFFERENTIAL
HCT: 44 (ref 36–46)
Hemoglobin: 15 (ref 12.0–16.0)
Platelets: 380 10*3/uL (ref 150–400)
WBC: 12

## 2021-12-21 LAB — TSH: TSH: 0.39 — AB (ref 0.41–5.90)

## 2021-12-21 LAB — COMPREHENSIVE METABOLIC PANEL: Albumin: 4.6 (ref 3.5–5.0)

## 2021-12-21 LAB — TESTOSTERONE: Testosterone: 10

## 2021-12-25 DIAGNOSIS — B2790 Infectious mononucleosis, unspecified without complication: Secondary | ICD-10-CM | POA: Insufficient documentation

## 2022-01-02 ENCOUNTER — Other Ambulatory Visit: Payer: Self-pay | Admitting: Family Medicine

## 2022-01-02 DIAGNOSIS — J4520 Mild intermittent asthma, uncomplicated: Secondary | ICD-10-CM

## 2022-01-03 ENCOUNTER — Encounter: Payer: Self-pay | Admitting: Family Medicine

## 2022-01-03 NOTE — Telephone Encounter (Signed)
Left [facility] asking pt if she needed this sent to [facility]

## 2022-01-04 ENCOUNTER — Ambulatory Visit: Payer: 59 | Admitting: Endocrinology

## 2022-01-29 ENCOUNTER — Other Ambulatory Visit: Payer: Self-pay

## 2022-01-29 DIAGNOSIS — J4520 Mild intermittent asthma, uncomplicated: Secondary | ICD-10-CM

## 2022-01-29 MED ORDER — MONTELUKAST SODIUM 10 MG PO TABS: 10.0000 mg | ORAL_TABLET | Freq: Every day | ORAL | 3 refills | Status: DC

## 2022-02-26 ENCOUNTER — Encounter: Payer: Self-pay | Admitting: Family Medicine

## 2022-02-26 NOTE — Telephone Encounter (Signed)
Pt is scheduled 3.21.23 at 8:20am  ?

## 2022-03-05 ENCOUNTER — Other Ambulatory Visit: Payer: Self-pay

## 2022-03-05 ENCOUNTER — Ambulatory Visit: Payer: 59 | Admitting: Family Medicine

## 2022-03-05 VITALS — BP 110/82 | HR 92 | Temp 98.1°F | Ht 66.0 in | Wt 253.0 lb

## 2022-03-05 DIAGNOSIS — R5382 Chronic fatigue, unspecified: Secondary | ICD-10-CM

## 2022-03-05 DIAGNOSIS — M79645 Pain in left finger(s): Secondary | ICD-10-CM

## 2022-03-05 DIAGNOSIS — G4733 Obstructive sleep apnea (adult) (pediatric): Secondary | ICD-10-CM

## 2022-03-05 DIAGNOSIS — G4700 Insomnia, unspecified: Secondary | ICD-10-CM

## 2022-03-05 LAB — BASIC METABOLIC PANEL
BUN: 10 (ref 4–21)
CO2: 23 — AB (ref 13–22)
Chloride: 101 (ref 99–108)
Creatinine: 0.7 (ref 0.5–1.1)
Glucose: 90
Potassium: 4.8 mEq/L (ref 3.5–5.1)
Sodium: 137 (ref 137–147)

## 2022-03-05 LAB — HEPATIC FUNCTION PANEL
ALT: 50 U/L — AB (ref 7–35)
AST: 30 (ref 13–35)
Alkaline Phosphatase: 136 — AB (ref 25–125)
Bilirubin, Total: 0.2

## 2022-03-05 LAB — TSH: TSH: 1.4 (ref 0.41–5.90)

## 2022-03-05 LAB — COMPREHENSIVE METABOLIC PANEL
Albumin: 4.5 (ref 3.5–5.0)
Calcium: 9.5 (ref 8.7–10.7)
eGFR: 105

## 2022-03-05 LAB — TESTOSTERONE: Testosterone: 17

## 2022-03-05 LAB — CBC AND DIFFERENTIAL
HCT: 41 (ref 36–46)
Hemoglobin: 13.7 (ref 12.0–16.0)
Platelets: 309 10*3/uL (ref 150–400)
WBC: 6.8

## 2022-03-05 NOTE — Assessment & Plan Note (Signed)
Patient currently seen functional medicine provider.  Reviewed patient's external labs with her.  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 Other work-up was negative including most vitamins, blood counts, liver and kidney.  Of note her TSH was low however other testing was within normal limits.  She has been trying to exercise and working on healthy diet.  She has noted no improvement in her symptoms on Valtrex.  Discussed that I was hopeful that she would find solution with her functional medicine provider do not feel any additional labs would be warranted at this time. ?

## 2022-03-05 NOTE — Patient Instructions (Addendum)
Thumb pain ?- Likely some arthritis or a little tendon inflammation ?- can try thumb brace if pain worsens - can use at night -- one for arthritis of the thumb would be fine ?- Voltaren Gel or Ibuprofen or aleve as needed ? ?Sleep ?- try a holiday or break from taking sleep aids ?- then after 1 week try one at a time ?- Discuss with therapist if they do CBT-Insomnia  ? ?Keep me posted about the fatigue ?

## 2022-03-05 NOTE — Assessment & Plan Note (Signed)
Suspect this might be some mild arthritis versus a muscles flareup or tendon flareup.  Advised as needed Voltaren gel or anti-inflammatory and discussed getting a brace for thumb arthritis for as needed use.  If not improving or worsening advised making appointment with Dr. Edilia Bo or consideration for physical therapy. ?

## 2022-03-05 NOTE — Progress Notes (Signed)
? ?Subjective:  ? ?  ?Maria Clark is a 51 y.o. female presenting for Hand Pain (L thumb, painful and lost strength in it x 1 month ) ?  ? ? ?Hand Pain  ?The incident occurred more than 1 week ago. Incident location: no known injury. There was no injury mechanism. The pain is present in the left hand. Quality: sharp. The pain does not radiate. The pain is at a severity of 2/10 (with use will go to 4-5). The pain has been Constant since the incident. Associated symptoms include muscle weakness. Exacerbated by: gripping, squeezing. She has tried nothing for the symptoms.  ? ?Is improving ?Tried modifying how she held her phone ? ? ?#Chronic tiredness ?- working with functional medicine doctor ?- epstein bar is reactivated  ?- also working on gut health ?- taking valtrex x 6 weeks ?- fatigue is awful ?- hard to get through work  ?- using bipap at night ?- low sugar diet ?- exercise is hard due to fatigue ?- drinking more water ? ?Trouble falling asleep and sleeping through the night ?Taking progesterone, magnesium, naltrexron, melatonin, benadryl ?Had difficulty getting off trazodone in the past ?Had DNA testing - rapid metabolizer  ? ?Review of Systems ? ? ?Social History  ? ?Tobacco Use  ?Smoking Status Never  ? Passive exposure: Never  ?Smokeless Tobacco Never  ? ? ? ?   ?Objective:  ?  ?BP Readings from Last 3 Encounters:  ?03/05/22 110/82  ?12/20/21 132/80  ?12/16/21 124/61  ? ?Wt Readings from Last 3 Encounters:  ?03/05/22 253 lb (114.8 kg)  ?12/12/21 282 lb 6 oz (128.1 kg)  ?10/24/21 279 lb 9.6 oz (126.8 kg)  ? ? ?BP 110/82   Pulse 92   Temp 98.1 ?F (36.7 ?C) (Oral)   Ht '5\' 6"'$  (1.676 m)   Wt 253 lb (114.8 kg)   SpO2 99%   BMI 40.84 kg/m?  ? ? ?Physical Exam ?Constitutional:   ?   General: She is not in acute distress. ?   Appearance: She is well-developed. She is not diaphoretic.  ?HENT:  ?   Right Ear: External ear normal.  ?   Left Ear: External ear normal.  ?   Nose: Nose normal.  ?Eyes:  ?    Conjunctiva/sclera: Conjunctivae normal.  ?Cardiovascular:  ?   Rate and Rhythm: Normal rate and regular rhythm.  ?   Heart sounds: No murmur heard. ?Pulmonary:  ?   Effort: Pulmonary effort is normal. No respiratory distress.  ?   Breath sounds: Normal breath sounds. No wheezing.  ?Musculoskeletal:  ?   Cervical back: Neck supple.  ?   Comments: Left hand ?Inspection: no swelling, erythema ?Palpation: TTP along the first MCP bone, w/o joint ttp and the flexor and abductor muscles of the thumb ?ROM: normal with pain with flexion/abduction of the thumb ?Strength: normal but pain with resisted flexion/abduction of the thumb ?Negative grind  ?Skin: ?   General: Skin is warm and dry.  ?   Capillary Refill: Capillary refill takes less than 2 seconds.  ?Neurological:  ?   Mental Status: She is alert. Mental status is at baseline.  ?Psychiatric:     ?   Mood and Affect: Mood normal.     ?   Behavior: Behavior normal.  ? ? ? ? ? ?   ?Assessment & Plan:  ? ?Problem List Items Addressed This Visit   ? ?  ? Respiratory  ? OSA treated with BiPAP  ?  She is compliant with her BiPAP and notes that her symptoms are controlled.  However continues to have persistent daytime tiredness. ?  ?  ?  ? Other  ? Chronic fatigue  ?  Patient currently seen functional medicine provider.  Reviewed patient's external labs with her.  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 Other work-up was negative including most vitamins, blood counts, liver and kidney.  Of note her TSH was low however other testing was within normal limits.  She has been trying to exercise and working on healthy diet.  She has noted no improvement in her symptoms on Valtrex.  Discussed that I was hopeful that she would find solution with her functional medicine provider do not feel any additional labs would be warranted at this time. ?  ?  ? Pain of left thumb - Primary  ?  Suspect this might be some mild arthritis versus a muscles flareup or  tendon flareup.  Advised as needed Voltaren gel or anti-inflammatory and discussed getting a brace for thumb arthritis for as needed use.  If not improving or worsening advised making appointment with Dr. Edilia Bo or consideration for physical therapy. ?  ?  ? Insomnia  ?  She is failing her outpatient regimen which includes a herbal melatonin as well as Benadryl.  Advised stopping both and giving herself of holiday and restarting 1 at a time to see if either are effective.  She has not done well with trazodone in the past.  Also recommend checking with her therapist to see if they perform CBT insomnia treatment. ?  ?  ? ? ?I spent >30 minutes with pt , obtaining history, examining, reviewing chart, documenting encounter and discussing the above plan of care. ? ?Return if symptoms worsen or fail to improve. ? ?Lesleigh Noe, MD ? ?This visit occurred during the SARS-CoV-2 public health emergency.  Safety protocols were in place, including screening questions prior to the visit, additional usage of staff PPE, and extensive cleaning of exam room while observing appropriate contact time as indicated for disinfecting solutions.  ? ?

## 2022-03-05 NOTE — Assessment & Plan Note (Signed)
She is failing her outpatient regimen which includes a herbal melatonin as well as Benadryl.  Advised stopping both and giving herself of holiday and restarting 1 at a time to see if either are effective.  She has not done well with trazodone in the past.  Also recommend checking with her therapist to see if they perform CBT insomnia treatment. ?

## 2022-03-05 NOTE — Assessment & Plan Note (Signed)
She is compliant with her BiPAP and notes that her symptoms are controlled.  However continues to have persistent daytime tiredness. ?

## 2022-04-09 ENCOUNTER — Ambulatory Visit: Payer: 59 | Admitting: Family Medicine

## 2022-04-09 VITALS — BP 110/70 | HR 91 | Temp 97.7°F | Ht 66.0 in | Wt 243.4 lb

## 2022-04-09 DIAGNOSIS — K529 Noninfective gastroenteritis and colitis, unspecified: Secondary | ICD-10-CM

## 2022-04-09 MED ORDER — CHOLESTYRAMINE 4 G PO PACK: PACK | ORAL | 1 refills | Status: DC

## 2022-04-09 NOTE — Assessment & Plan Note (Signed)
Chronic with worsening symptoms over the last week.  Possible acute on chronic picture.  Of note she has had a history of a gallbladder removal, notes immediate diarrhea after eating.  Discussed getting infectious work-up due to new worsening symptoms as well as ruling out celiac.  She is currently on a gluten-free diet so discussed that a negative test may not completely rule this out.  At this time we will initiate treatment for possible bile acid malabsorption with trial of Questran.  She will update with response. ?

## 2022-04-09 NOTE — Patient Instructions (Signed)
Start Questran once daily ?After 1 week, if no change increase to twice daily ? ?Continue gluten free diet ? ?Labs today ?Bring back stool sample if no change in symptom ?

## 2022-04-09 NOTE — Progress Notes (Signed)
? ?Subjective:  ? ?  ?Maria Clark is a 51 y.o. female presenting for Diarrhea (X 1 week) ?  ? ? ?Diarrhea  ?This is a new problem. The current episode started 1 to 4 weeks ago (worse in the last week). The problem occurs 2 to 4 times per day. The problem has been gradually improving. The stool consistency is described as Watery. The patient states that diarrhea does not awaken her from sleep. Associated symptoms include abdominal pain. Pertinent negatives include no bloating, chills, fever, increased  flatus, myalgias or vomiting. Associated symptoms comments: nausea. Exacerbated by: any food, eating. She has tried change of diet and increased fluids (binder and enzymes) for the symptoms. GB removed  ? ?Working with functional medicine doctor ?Started taking a binder - with activated charcol ?Had GB removed a 1.5 years ago with worsening diarrhea since then - also with Enzyme supplement ?Has been doing gluten free diet for the last 3 weeks - with improvement in fatigue, but no change in bowel habits ? ? ?Review of Systems  ?Constitutional:  Negative for chills and fever.  ?Gastrointestinal:  Positive for abdominal pain and diarrhea. Negative for bloating, flatus and vomiting.  ?Musculoskeletal:  Negative for myalgias.  ? ? ?Social History  ? ?Tobacco Use  ?Smoking Status Never  ? Passive exposure: Never  ?Smokeless Tobacco Never  ? ? ? ?   ?Objective:  ?  ?BP Readings from Last 3 Encounters:  ?04/09/22 110/70  ?03/05/22 110/82  ?12/20/21 132/80  ? ?Wt Readings from Last 3 Encounters:  ?04/09/22 243 lb 6 oz (110.4 kg)  ?03/05/22 253 lb (114.8 kg)  ?12/12/21 282 lb 6 oz (128.1 kg)  ? ? ?BP 110/70   Pulse 91   Temp 97.7 ?F (36.5 ?C) (Oral)   Ht '5\' 6"'$  (1.676 m)   Wt 243 lb 6 oz (110.4 kg)   SpO2 98%   BMI 39.28 kg/m?  ? ? ?Physical Exam ?Constitutional:   ?   General: She is not in acute distress. ?   Appearance: She is well-developed. She is not diaphoretic.  ?HENT:  ?   Right Ear: External ear  normal.  ?   Left Ear: External ear normal.  ?   Nose: Nose normal.  ?Eyes:  ?   Conjunctiva/sclera: Conjunctivae normal.  ?Cardiovascular:  ?   Rate and Rhythm: Normal rate and regular rhythm.  ?   Heart sounds: No murmur heard. ?Pulmonary:  ?   Effort: Pulmonary effort is normal. No respiratory distress.  ?   Breath sounds: Normal breath sounds.  ?Abdominal:  ?   General: Abdomen is flat. Bowel sounds are normal. There is no distension.  ?   Palpations: Abdomen is soft.  ?   Tenderness: There is abdominal tenderness in the epigastric area. There is no guarding.  ?Musculoskeletal:  ?   Cervical back: Neck supple.  ?Skin: ?   General: Skin is warm and dry.  ?   Capillary Refill: Capillary refill takes less than 2 seconds.  ?Neurological:  ?   Mental Status: She is alert. Mental status is at baseline.  ?Psychiatric:     ?   Mood and Affect: Mood normal.     ?   Behavior: Behavior normal.  ? ? ? ? ? ?   ?Assessment & Plan:  ? ?Problem List Items Addressed This Visit   ? ?  ? Digestive  ? Chronic diarrhea - Primary  ?  Chronic with worsening symptoms over the  last week.  Possible acute on chronic picture.  Of note she has had a history of a gallbladder removal, notes immediate diarrhea after eating.  Discussed getting infectious work-up due to new worsening symptoms as well as ruling out celiac.  She is currently on a gluten-free diet so discussed that a negative test may not completely rule this out.  At this time we will initiate treatment for possible bile acid malabsorption with trial of Questran.  She will update with response. ? ?  ?  ? Relevant Medications  ? cholestyramine (QUESTRAN) 4 g packet  ? Other Relevant Orders  ? Celiac Pnl 2 rflx Endomysial Ab Ttr  ? Gastrointestinal Pathogen Pnl RT, PCR  ? Giardia antigen  ? ? ? ?Return if symptoms worsen or fail to improve. ? ?Lesleigh Noe, MD ? ? ? ?

## 2022-04-19 ENCOUNTER — Encounter: Payer: Self-pay | Admitting: Family Medicine

## 2022-04-21 LAB — CELIAC PNL 2 RFLX ENDOMYSIAL AB TTR
(tTG) Ab, IgA: <1 U/mL
(tTG) Ab, IgG: <1 U/mL
Deamidated Gliadin Abs, IgG: <1 U/mL
Endomysial Ab IgA: NEGATIVE
Gliadin IgA: <1 U/mL
Immunoglobulin A: 114 mg/dL (ref 47–310)

## 2022-05-03 ENCOUNTER — Other Ambulatory Visit: Payer: Self-pay | Admitting: Family Medicine

## 2022-05-03 DIAGNOSIS — K529 Noninfective gastroenteritis and colitis, unspecified: Secondary | ICD-10-CM

## 2022-05-06 NOTE — Telephone Encounter (Signed)
Mychart sent to pt.

## 2022-06-21 ENCOUNTER — Encounter: Payer: Self-pay | Admitting: Family Medicine

## 2022-06-21 NOTE — Telephone Encounter (Signed)
Spoke to patient by telephone and was advised that she is feeling fine today and no thoughts of suicide. Patient stated that her therapist and psychiatrist is aware of her condition now. Patient stated that she now has been set up for outpatient treatment next week at [facility]. Patient stated that she has not had any suicidal thoughts since Wednesday.Patient was given ER precautions and she verbalized understanding. Patient stated if she starts having bad thoughts before next week she will go to the ER.

## 2022-06-21 NOTE — Telephone Encounter (Signed)
Noted and appreciate the follow through.  Agree with recommendations and plan.

## 2022-07-04 DIAGNOSIS — F332 Major depressive disorder, recurrent severe without psychotic features: Secondary | ICD-10-CM | POA: Insufficient documentation

## 2022-07-17 ENCOUNTER — Ambulatory Visit: Payer: 59 | Admitting: Family Medicine

## 2022-08-16 ENCOUNTER — Other Ambulatory Visit: Payer: Self-pay | Admitting: Family Medicine

## 2022-08-16 DIAGNOSIS — Z1231 Encounter for screening mammogram for malignant neoplasm of breast: Secondary | ICD-10-CM

## 2022-09-03 ENCOUNTER — Ambulatory Visit: Payer: 59

## 2022-09-03 ENCOUNTER — Ambulatory Visit
Admission: RE | Admit: 2022-09-03 | Discharge: 2022-09-03 | Disposition: A | Discharge status: Home / Self Care | Payer: 59 | Source: Ambulatory Visit | Attending: Family Medicine | Admitting: Family Medicine

## 2022-09-03 DIAGNOSIS — Z1231 Encounter for screening mammogram for malignant neoplasm of breast: Secondary | ICD-10-CM

## 2022-09-03 NOTE — Progress Notes (Unsigned)
New Patient Visit  There were no vitals taken for this visit.   Subjective:    Patient ID: Maria Clark, female    DOB: Jul 22, 1971, 51 y.o.   MRN: [id number]  CC: No chief complaint on file.   HPI: Maria Clark is a 51 y.o. female presents for new patient visit to establish care.  Introduced to Designer, jewellery role and practice setting.  All questions answered.  Discussed provider/patient relationship and expectations.   Past Medical History:  Diagnosis Date   Anemia    Asthma    Complication of anesthesia    woozy out of it for more than a day previously   Depression    Endometriosis    Mental disorder    Depression   Sleep apnea     Past Surgical History:  Procedure Laterality Date   ANTERIOR CRUCIATE LIGAMENT REPAIR     right x 2, left x 1   LAPAROSCOPY      Family History  Problem Relation Age of Onset   Hypertension Mother    Arthritis Mother    Aneurysm Paternal Grandfather        stomach - smoker   Aneurysm Paternal Aunt        cardiac - non-smoker   Polycystic ovary syndrome Neg Hx      Social History   Tobacco Use   Smoking status: Never    Passive exposure: Never   Smokeless tobacco: Never  Vaping Use   Vaping Use: Never used  Substance Use Topics   Alcohol use: Yes    Comment: rarely   Drug use: Never    Current Outpatient Medications on File Prior to Visit  Medication Sig Dispense Refill   acetaminophen (TYLENOL) 500 MG tablet Take 1,000 mg by mouth every 6 (six) hours as needed for moderate pain or headache.     albuterol (VENTOLIN HFA) 108 (90 Base) MCG/ACT inhaler Inhale 2 puffs into the lungs every 6 (six) hours as needed for wheezing or shortness of breath. 1 each 1   b complex vitamins capsule Take 1 capsule by mouth daily.     buPROPion (WELLBUTRIN XL) 150 MG 24 hr tablet TAKE 2 TABLETS (300 MG TOTAL) BY MOUTH DAILY. 180 tablet 3   Carboxymethylcellul-Glycerin (LUBRICATING EYE DROPS OP) Place 1 drop  into both eyes daily as needed (dry eyes).     Cholecalciferol (VITAMIN D) 50 MCG (2000 UT) tablet Take 2,000 Units by mouth daily.     cholestyramine (QUESTRAN) 4 g packet Start with 4 gm (1 pack daily). If no response increase to twice daily after 1 week. 60 each 1   desvenlafaxine (PRISTIQ) 50 MG 24 hr tablet Take 50 mg by mouth daily.     Ferrous Sulfate (IRON PO) Take 1 tablet by mouth daily.     ibuprofen (ADVIL) 200 MG tablet Take 400 mg by mouth every 6 (six) hours as needed for headache or moderate pain.     montelukast (SINGULAIR) 10 MG tablet Take 1 tablet (10 mg total) by mouth at bedtime. 90 tablet 3   Naltrexone HCl, Pain, 1.5 MG CAPS TAKE ONE CAPSULE AT BEDTIME FOR ONE WEEK THEN TAKE TWO CAPSULES FOR ONE WEEK THEN TAKE THREE CAPSULES AT BEDTIME THEREAFTER     rOPINIRole (REQUIP) 1 MG tablet Take 1 mg by mouth at bedtime.     spironolactone (ALDACTONE) 50 MG tablet Take 1 tablet (50 mg total) by mouth daily. 90 tablet 3   valACYclovir (  VALTREX) 1000 MG tablet Take 1,000 mg by mouth.     No current facility-administered medications on file prior to visit.     Review of Systems      Objective:    There were no vitals taken for this visit.  Wt Readings from Last 3 Encounters:  04/09/22 243 lb 6 oz (110.4 kg)  03/05/22 253 lb (114.8 kg)  12/12/21 282 lb 6 oz (128.1 kg)    BP Readings from Last 3 Encounters:  04/09/22 110/70  03/05/22 110/82  12/20/21 132/80    Physical Exam     Assessment & Plan:   Problem List Items Addressed This Visit   None    Follow up plan: No follow-ups on file.

## 2022-09-04 ENCOUNTER — Encounter: Payer: Self-pay | Admitting: Nurse Practitioner

## 2022-09-04 ENCOUNTER — Ambulatory Visit (INDEPENDENT_AMBULATORY_CARE_PROVIDER_SITE_OTHER): Payer: 59 | Admitting: Nurse Practitioner

## 2022-09-04 VITALS — BP 138/72 | HR 79 | Temp 97.6°F | Wt 244.6 lb

## 2022-09-04 DIAGNOSIS — I10 Essential (primary) hypertension: Secondary | ICD-10-CM | POA: Diagnosis not present

## 2022-09-04 DIAGNOSIS — Z23 Encounter for immunization: Secondary | ICD-10-CM

## 2022-09-04 DIAGNOSIS — R5382 Chronic fatigue, unspecified: Secondary | ICD-10-CM

## 2022-09-04 DIAGNOSIS — J4520 Mild intermittent asthma, uncomplicated: Secondary | ICD-10-CM

## 2022-09-04 DIAGNOSIS — G4733 Obstructive sleep apnea (adult) (pediatric): Secondary | ICD-10-CM | POA: Diagnosis not present

## 2022-09-04 DIAGNOSIS — G2581 Restless legs syndrome: Secondary | ICD-10-CM

## 2022-09-04 DIAGNOSIS — L989 Disorder of the skin and subcutaneous tissue, unspecified: Secondary | ICD-10-CM

## 2022-09-04 DIAGNOSIS — M542 Cervicalgia: Secondary | ICD-10-CM | POA: Insufficient documentation

## 2022-09-04 DIAGNOSIS — N809 Endometriosis, unspecified: Secondary | ICD-10-CM

## 2022-09-04 DIAGNOSIS — F3342 Major depressive disorder, recurrent, in full remission: Secondary | ICD-10-CM

## 2022-09-04 DIAGNOSIS — E669 Obesity, unspecified: Secondary | ICD-10-CM | POA: Insufficient documentation

## 2022-09-04 DIAGNOSIS — K529 Noninfective gastroenteritis and colitis, unspecified: Secondary | ICD-10-CM

## 2022-09-04 MED ORDER — CYCLOBENZAPRINE HCL 10 MG PO TABS: 10.0000 mg | ORAL_TABLET | Freq: Three times a day (TID) | ORAL | 2 refills | Status: AC | PRN

## 2022-09-04 NOTE — Assessment & Plan Note (Signed)
Chronic, stable. She was using a bipap for many years, however felt like this didn't help her symptoms at all. She is still having ongoing daytime fatigue.

## 2022-09-04 NOTE — Assessment & Plan Note (Signed)
BMI 39.4. She has lost 34 pounds since January. Congratulated her on this! Continue dietary changes and exercise.

## 2022-09-04 NOTE — Assessment & Plan Note (Signed)
Chronic, stable. She states that her periods are getting more sporadic in nature. She is following with GYN.

## 2022-09-04 NOTE — Assessment & Plan Note (Signed)
Acute neck pain that started a few weeks after a fall. She also looks down at a computer screen during work. Could be neck strain from work vs strain from fall. She can use voltaren gel as needed for pain, ibuprofen or tylenol, and also flexeril '10mg'$  prn tightness/spasm. Follow-up in 4 weeks.

## 2022-09-04 NOTE — Assessment & Plan Note (Signed)
Chronic, ongoing. She is currently working with a functional medicine provider and her prior PCP to determine a cause for her ongoing fatigue. Her last PCP states that it could be due to peri-menopause. She had numerous tests completed, she will bring in a copy of all of her labs. She was noted to be low on vitamin B and started a vitamin B complex. She was also noted to have elevated estrogen levels and progesterone. She is taking spironolactone '50mg'$  daily which has brought these levels down. 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 Once receiving her labs, will review her history to come up with further evaluation/recommendations.

## 2022-09-04 NOTE — Assessment & Plan Note (Signed)
Chronic, stable. Not currently taking any medications for this, well controlled with diet.

## 2022-09-04 NOTE — Assessment & Plan Note (Signed)
Chronic, stable. She uses an albuterol inhaler as needed which is approximately 3 times per month. Continue this medication and follow-up if symptoms worsen or start occurring more frequently.

## 2022-09-04 NOTE — Assessment & Plan Note (Signed)
Chronic, stable. It happens after eating certain foods since having her gallbladder removed. She takes Welchol 625 mg BID as needed.

## 2022-09-04 NOTE — Assessment & Plan Note (Signed)
Chronic, stable. Continue requip '1mg'$  daily at bedtime.

## 2022-09-04 NOTE — Assessment & Plan Note (Signed)
She has a history of depression and anxiety and states that she was hospitalized recently for this along with suicidal ideation. She states that her symptoms have improved significantly since this hospitalization. She is currently following with psychiatry and a therapist. She denies SI/HI. She is taking wellbutrin '150mg'$  daily, lexapro '20mg'$  daily, hydroxyzine as needed. Continue this regimen and collaboration with psychiatry and therapist.

## 2022-09-04 NOTE — Patient Instructions (Signed)
It was great to see you!  Try voltaren 4 times a day as needed for your neck pain. Start flexeril as needed for pain/tightness in your neck. This may make you sleepy.  I am placing a referral to dermatology for you. They will call to schedule.   Let's follow-up in 4 weeks, sooner if you have concerns.  If a referral was placed today, you will be contacted for an appointment. Please note that routine referrals can sometimes take up to 3-4 weeks to process. Please call our office if you haven't heard anything after this time frame.  Take care,  Vance Peper, NP

## 2022-09-05 ENCOUNTER — Encounter: Payer: Self-pay | Admitting: Nurse Practitioner

## 2022-09-06 ENCOUNTER — Encounter: Payer: Self-pay | Admitting: Nurse Practitioner

## 2022-09-10 ENCOUNTER — Ambulatory Visit (INDEPENDENT_AMBULATORY_CARE_PROVIDER_SITE_OTHER): Payer: 59

## 2022-09-10 DIAGNOSIS — E538 Deficiency of other specified B group vitamins: Secondary | ICD-10-CM | POA: Diagnosis not present

## 2022-09-10 MED ORDER — CYANOCOBALAMIN 1000 MCG/ML IJ SOLN
1000.0000 ug | Freq: Once | INTRAMUSCULAR | Status: AC
  Administered 2022-09-10: 1000 ug via INTRAMUSCULAR

## 2022-09-10 NOTE — Progress Notes (Addendum)
Patient presents for B12 injection. Injection placed in right deltoid region. Patient tolerated procedure well with no concerns.

## 2022-09-11 ENCOUNTER — Encounter: Payer: Self-pay | Admitting: Family Medicine

## 2022-09-11 ENCOUNTER — Ambulatory Visit (INDEPENDENT_AMBULATORY_CARE_PROVIDER_SITE_OTHER): Payer: 59 | Admitting: Family Medicine

## 2022-09-11 VITALS — BP 123/63 | HR 105 | Wt 242.0 lb

## 2022-09-11 DIAGNOSIS — N912 Amenorrhea, unspecified: Secondary | ICD-10-CM | POA: Diagnosis not present

## 2022-09-11 DIAGNOSIS — R5382 Chronic fatigue, unspecified: Secondary | ICD-10-CM

## 2022-09-11 NOTE — Progress Notes (Unsigned)
   Subjective:    Patient ID: Maria Clark is a 51 y.o. female presenting with Gynecologic Exam  on 09/11/2022  HPI: Patient returns for f/u. Previously seen for chronic fatigue and thought her testosterone was low, but that seemed to be high with low progesterone. Sent to Endocrinology and placed on spironolactone, which did not help. She is not menopausal but does have some spotting occasionally. She has recent diagnosis of elvated EBV activity, and is on Valtrex for that. Recently began Vitamin B 12 injections and on an oral B complex. Remains very fatigued and is wanting answers. Last pelvic u/s did not show thickened endometrium or poly-cystic ovaries, though she had been on continuous COCs prior to this u/s and that might have skewed that.  Review of Systems  Constitutional:  Negative for chills and fever.  Respiratory:  Negative for shortness of breath.   Cardiovascular:  Negative for chest pain.  Gastrointestinal:  Negative for abdominal pain, nausea and vomiting.  Genitourinary:  Negative for dysuria.  Skin:  Negative for rash.      Objective:    BP 123/63   Pulse (!) 105   Wt 242 lb (109.8 kg)   BMI 39.06 kg/m  Physical Exam Exam conducted with a chaperone present.  Constitutional:      General: She is not in acute distress.    Appearance: She is well-developed.  HENT:     Head: Normocephalic and atraumatic.  Eyes:     General: No scleral icterus. Cardiovascular:     Rate and Rhythm: Normal rate.  Pulmonary:     Effort: Pulmonary effort is normal.  Abdominal:     Palpations: Abdomen is soft.  Musculoskeletal:     Cervical back: Neck supple.  Skin:    General: Skin is warm and dry.  Neurological:     Mental Status: She is alert and oriented to person, place, and time.         Assessment & Plan:   Problem List Items Addressed This Visit       Unprioritized   Chronic fatigue    Unclear what is the primary driver of this. It is not clear that  she is peri-menopausal or that this alone is driving this. EBV and B12 deficiency all need to treated and improved.       Amenorrhea - Primary    Given that she is not menopausal at present and has low prog, and elevated Estrogen, I have concerns about the protection of the endometrium. Will begin with an u/s to eval the endometrial thickness and discussed possibility of need for sampling. She will not tolerate this in the office, so possible D & C hysteroscopy discussed if needed. Could consider prog containing IUD for endometrial protection if needed at same time. She seemed hesitant to do that.      Relevant Orders   US PELVIC COMPLETE WITH TRANSVAGINAL    Return in about 3 months (around 12/11/2022) for a follow-up.  Donnamae Jude, MD 09/11/2022 3:48 PM

## 2022-09-11 NOTE — Progress Notes (Unsigned)
Discuss hormone levels and still struggling with chronic fatigue  In January pt epstein-barr had a flare up

## 2022-09-12 ENCOUNTER — Encounter: Payer: Self-pay | Admitting: Family Medicine

## 2022-09-12 DIAGNOSIS — N912 Amenorrhea, unspecified: Secondary | ICD-10-CM | POA: Insufficient documentation

## 2022-09-12 NOTE — Assessment & Plan Note (Signed)
Given that she is not menopausal at present and has low prog, and elevated Estrogen, I have concerns about the protection of the endometrium. Will begin with an u/s to eval the endometrial thickness and discussed possibility of need for sampling. She will not tolerate this in the office, so possible D & C hysteroscopy discussed if needed. Could consider prog containing IUD for endometrial protection if needed at same time. She seemed hesitant to do that.

## 2022-09-12 NOTE — Assessment & Plan Note (Signed)
Unclear what is the primary driver of this. It is not clear that she is peri-menopausal or that this alone is driving this. EBV and B12 deficiency all need to treated and improved.

## 2022-09-16 ENCOUNTER — Encounter: Payer: Self-pay | Admitting: Nurse Practitioner

## 2022-09-17 ENCOUNTER — Ambulatory Visit (INDEPENDENT_AMBULATORY_CARE_PROVIDER_SITE_OTHER): Payer: 59

## 2022-09-17 DIAGNOSIS — E538 Deficiency of other specified B group vitamins: Secondary | ICD-10-CM

## 2022-09-17 LAB — HEPATIC FUNCTION PANEL
ALT: 33 U/L (ref 7–35)
AST: 20 (ref 13–35)
Alkaline Phosphatase: 140 — AB (ref 25–125)

## 2022-09-17 LAB — BASIC METABOLIC PANEL
BUN: 13 (ref 4–21)
CO2: 26 — AB (ref 13–22)
Chloride: 99 (ref 99–108)
Creatinine: 0.6 (ref 0.5–1.1)
Glucose: 85
Potassium: 5 mEq/L (ref 3.5–5.1)
Sodium: 138 (ref 137–147)

## 2022-09-17 LAB — CBC AND DIFFERENTIAL
HCT: 41 (ref 36–46)
Hemoglobin: 13.7 (ref 12.0–16.0)
Platelets: 336 10*3/uL (ref 150–400)
WBC: 7.6

## 2022-09-17 LAB — TSH: TSH: 1.44 (ref 0.41–5.90)

## 2022-09-17 LAB — HEMOGLOBIN A1C: Hemoglobin A1C: 5.3

## 2022-09-17 LAB — TESTOSTERONE: Testosterone: 17

## 2022-09-17 LAB — COMPREHENSIVE METABOLIC PANEL: Albumin: 4.5 (ref 3.5–5.0)

## 2022-09-17 LAB — IRON,TIBC AND FERRITIN PANEL: Ferritin: 155

## 2022-09-17 LAB — VITAMIN D 25 HYDROXY (VIT D DEFICIENCY, FRACTURES): Vit D, 25-Hydroxy: 52.5

## 2022-09-17 MED ORDER — CYANOCOBALAMIN 1000 MCG/ML IJ SOLN
1000.0000 ug | Freq: Once | INTRAMUSCULAR | Status: AC
  Administered 2022-09-17: 1000 ug via INTRAMUSCULAR

## 2022-09-17 NOTE — Progress Notes (Addendum)
Pt here for monthly B12 injection per Isa Rankin, NP.  B12 1035mg given IM right deltoid, and pt tolerated injection well.  Next B12 injection scheduled for 09/24/22.

## 2022-09-20 ENCOUNTER — Ambulatory Visit (HOSPITAL_COMMUNITY)
Admission: RE | Admit: 2022-09-20 | Discharge: 2022-09-20 | Disposition: A | Discharge status: Home / Self Care | Payer: 59 | Source: Ambulatory Visit | Attending: Family Medicine | Admitting: Family Medicine

## 2022-09-20 DIAGNOSIS — N912 Amenorrhea, unspecified: Secondary | ICD-10-CM | POA: Insufficient documentation

## 2022-09-22 ENCOUNTER — Encounter: Payer: Self-pay | Admitting: Family Medicine

## 2022-09-24 ENCOUNTER — Encounter: Payer: Self-pay | Admitting: Family Medicine

## 2022-09-24 ENCOUNTER — Ambulatory Visit (INDEPENDENT_AMBULATORY_CARE_PROVIDER_SITE_OTHER): Payer: 59

## 2022-09-24 DIAGNOSIS — E538 Deficiency of other specified B group vitamins: Secondary | ICD-10-CM

## 2022-09-24 MED ORDER — CYANOCOBALAMIN 1000 MCG/ML IJ SOLN
1000.0000 ug | Freq: Once | INTRAMUSCULAR | Status: AC
  Administered 2022-09-24: 1000 ug via INTRAMUSCULAR

## 2022-09-24 NOTE — Progress Notes (Signed)
Per orders of Vance Peper, NP pt is here for b12 injection. Pt received b12 injection in right deltoid at 09:15 am. Given by [place] L . CMA/CPT. Pt tolerated B12 injection well. Pt will return in 1 week to get next b12 injection. Appointment scheduled for 10/01/2022 at 9:00 am

## 2022-09-25 ENCOUNTER — Encounter: Payer: Self-pay | Admitting: Family Medicine

## 2022-10-01 ENCOUNTER — Encounter: Payer: Self-pay | Admitting: Nurse Practitioner

## 2022-10-01 ENCOUNTER — Ambulatory Visit: Payer: 59

## 2022-10-01 ENCOUNTER — Ambulatory Visit (INDEPENDENT_AMBULATORY_CARE_PROVIDER_SITE_OTHER): Payer: 59 | Admitting: Nurse Practitioner

## 2022-10-01 VITALS — BP 129/78 | HR 81 | Temp 97.0°F | Wt 246.8 lb

## 2022-10-01 DIAGNOSIS — E538 Deficiency of other specified B group vitamins: Secondary | ICD-10-CM | POA: Diagnosis not present

## 2022-10-01 DIAGNOSIS — D509 Iron deficiency anemia, unspecified: Secondary | ICD-10-CM

## 2022-10-01 DIAGNOSIS — F332 Major depressive disorder, recurrent severe without psychotic features: Secondary | ICD-10-CM

## 2022-10-01 DIAGNOSIS — Z0001 Encounter for general adult medical examination with abnormal findings: Secondary | ICD-10-CM

## 2022-10-01 DIAGNOSIS — R5382 Chronic fatigue, unspecified: Secondary | ICD-10-CM

## 2022-10-01 DIAGNOSIS — R7989 Other specified abnormal findings of blood chemistry: Secondary | ICD-10-CM

## 2022-10-01 DIAGNOSIS — J4520 Mild intermittent asthma, uncomplicated: Secondary | ICD-10-CM

## 2022-10-01 LAB — GAMMA GT: GGT: 19 U/L (ref 7–51)

## 2022-10-01 MED ORDER — CYANOCOBALAMIN 1000 MCG/ML IJ SOLN
1000.0000 ug | Freq: Once | INTRAMUSCULAR | Status: AC
  Administered 2022-10-01: 1000 ug via INTRAMUSCULAR

## 2022-10-01 NOTE — Assessment & Plan Note (Signed)
LFTs elevated consistently. Will check GGT and RUQ ultrasound.

## 2022-10-01 NOTE — Progress Notes (Signed)
BP 129/78   Pulse 81   Temp (!) 97 F (36.1 C) (Temporal)   Wt 246 lb 12.8 oz (111.9 kg)   SpO2 97%   BMI 39.83 kg/m    Subjective:    Patient ID: Maria Clark, female    DOB: 1971-03-03, 51 y.o.   MRN: [id number]  CC: Chief Complaint  Patient presents with   Annual Exam    CPE, pt is fasting. Weekly B12 injection   HPI: Maria Clark is a 51 y.o. female presenting on 10/01/2022 for comprehensive medical examination. Current medical complaints include: ongoing fatigue.  She states that she is still having ongoing fatigue. She states the last 3 vitamin B12 injections have not helped with the fatigue.   She also saw GYN and was told that she is not menopausal yet, and they are not sure why her estrogen is elevated. She had a pelvic ultrasound and her endometrial stripe was 70m.   She currently lives with: alone Menopausal Symptoms: no  Depression Screen done today and results listed below:     10/01/2022    8:54 AM 09/04/2022    4:12 PM 06/26/2021    9:28 AM 09/11/2020    9:15 AM  Depression screen PHQ 2/9  Decreased Interest 0 0 1 0  Down, Depressed, Hopeless '1 1 1 '$ 0  PHQ - 2 Score '1 1 2 '$ 0  Altered sleeping '1 1 1   '$ Tired, decreased energy '3 3 2   '$ Change in appetite 1 1 0   Feeling bad or failure about yourself  0 0 0   Trouble concentrating 0 0 0   Moving slowly or fidgety/restless 0 0 0   Suicidal thoughts 1 0 0   PHQ-9 Score '7 6 5   '$ Difficult doing work/chores Somewhat difficult Somewhat difficult Somewhat difficult     The patient does not have a history of falls. I did not complete a risk assessment for falls. A plan of care for falls was not documented.   Past Medical History:  Past Medical History:  Diagnosis Date   Anemia    Asthma    Complication of anesthesia    woozy out of it for more than a day previously   Depression    Endometriosis    Mental disorder    Depression   Sleep apnea     Surgical History:  Past  Surgical History:  Procedure Laterality Date   ANTERIOR CRUCIATE LIGAMENT REPAIR     right x 2, left x 1   CHOLECYSTECTOMY     LAPAROSCOPY      Medications:  Current Outpatient Medications on File Prior to Visit  Medication Sig   acetaminophen (TYLENOL) 500 MG tablet Take 1,000 mg by mouth every 6 (six) hours as needed for moderate pain or headache.   albuterol (VENTOLIN HFA) 108 (90 Base) MCG/ACT inhaler Inhale 2 puffs into the lungs every 6 (six) hours as needed for wheezing or shortness of breath.   b complex vitamins capsule Take 1 capsule by mouth daily.   buPROPion (WELLBUTRIN XL) 150 MG 24 hr tablet TAKE 2 TABLETS (300 MG TOTAL) BY MOUTH DAILY.   Carboxymethylcellul-Glycerin (LUBRICATING EYE DROPS OP) Place 1 drop into both eyes daily as needed (dry eyes).   Cholecalciferol (VITAMIN D) 50 MCG (2000 UT) tablet Take 2,000 Units by mouth daily.   colesevelam (WELCHOL) 625 MG tablet Take 1,250 mg by mouth 2 (two) times daily with a meal.   cyclobenzaprine (FLEXERIL)  10 MG tablet Take 1 tablet (10 mg total) by mouth 3 (three) times daily as needed for muscle spasms.   escitalopram (LEXAPRO) 20 MG tablet Take 1 tablet by mouth daily.   Ferrous Sulfate (IRON PO) Take 1 tablet by mouth daily.   hydrOXYzine (ATARAX) 25 MG tablet TAKE 3 TABLETS 3 TIMES A DAY AS NEEDED FOR UP TO 10 DAYS   ibuprofen (ADVIL) 200 MG tablet Take 400 mg by mouth every 6 (six) hours as needed for headache or moderate pain.   Naltrexone HCl, Pain, (NALTREX) 4.5 MG CAPS Take by mouth.   rOPINIRole (REQUIP) 1 MG tablet Take 1 mg by mouth at bedtime.   spironolactone (ALDACTONE) 50 MG tablet Take 1 tablet (50 mg total) by mouth daily.   valACYclovir (VALTREX) 1000 MG tablet Take 1,000 mg by mouth.   No current facility-administered medications on file prior to visit.    Allergies:  Allergies  Allergen Reactions   Dust Mite Extract Other (See Comments)    Client reports "dust makes my asthma worse" it can  increase wheezing..     Social History:  Social History   Socioeconomic History   Marital status: Legally Separated    Spouse name: Anda Kraft   Number of children: 0   Years of education: Master's Degree   Highest education level: Not on file  Occupational History   Not on file  Tobacco Use   Smoking status: Never    Passive exposure: Never   Smokeless tobacco: Never  Vaping Use   Vaping Use: Never used  Substance and Sexual Activity   Alcohol use: Yes    Comment: rarely   Drug use: Never   Sexual activity: Yes    Comment: female partners  Other Topics Concern   Not on file  Social History Narrative      Social Determinants of Health   Financial Resource Strain: Not on file  Food Insecurity: Not on file  Transportation Needs: Not on file  Physical Activity: Not on file  Stress: Not on file  Social Connections: Not on file  Intimate Partner Violence: Not on file   Social History   Tobacco Use  Smoking Status Never   Passive exposure: Never  Smokeless Tobacco Never   Social History   Substance and Sexual Activity  Alcohol Use Yes   Comment: rarely    Family History:  Family History  Problem Relation Age of Onset   Hypertension Mother    Arthritis Mother    Benign prostatic hyperplasia Father    Aneurysm Paternal Aunt        cardiac - non-smoker   Aneurysm Paternal Grandfather        stomach - smoker   Polycystic ovary syndrome Neg Hx     Past medical history, surgical history, medications, allergies, family history and social history reviewed with patient today and changes made to appropriate areas of the chart.   Review of Systems  Constitutional:  Positive for malaise/fatigue. Negative for diaphoresis.  HENT: Negative.    Respiratory: Negative.    Cardiovascular: Negative.   Gastrointestinal: Negative.   Genitourinary: Negative.   Musculoskeletal: Negative.   Skin: Negative.   Neurological: Negative.   Psychiatric/Behavioral: Negative.      All other ROS negative except what is listed above and in the HPI.      Objective:    BP 129/78   Pulse 81   Temp (!) 97 F (36.1 C) (Temporal)   Wt 246 lb 12.8  oz (111.9 kg)   SpO2 97%   BMI 39.83 kg/m   Wt Readings from Last 3 Encounters:  10/01/22 246 lb 12.8 oz (111.9 kg)  09/11/22 242 lb (109.8 kg)  09/04/22 244 lb 9.6 oz (110.9 kg)    Physical Exam Vitals and nursing note reviewed.  Constitutional:      General: She is not in acute distress.    Appearance: Normal appearance.  HENT:     Head: Normocephalic and atraumatic.     Right Ear: Tympanic membrane, ear canal and external ear normal.     Left Ear: Tympanic membrane, ear canal and external ear normal.  Eyes:     Conjunctiva/sclera: Conjunctivae normal.  Cardiovascular:     Rate and Rhythm: Normal rate and regular rhythm.     Pulses: Normal pulses.     Heart sounds: Normal heart sounds.  Pulmonary:     Effort: Pulmonary effort is normal.     Breath sounds: Normal breath sounds.  Abdominal:     Palpations: Abdomen is soft.     Tenderness: There is no abdominal tenderness.  Musculoskeletal:        General: Normal range of motion.     Cervical back: Normal range of motion and neck supple.     Right lower leg: No edema.     Left lower leg: No edema.  Lymphadenopathy:     Cervical: No cervical adenopathy.  Skin:    General: Skin is warm and dry.  Neurological:     General: No focal deficit present.     Mental Status: She is alert and oriented to person, place, and time.     Cranial Nerves: No cranial nerve deficit.     Coordination: Coordination normal.     Gait: Gait normal.  Psychiatric:        Mood and Affect: Mood normal.        Behavior: Behavior normal.        Thought Content: Thought content normal.        Judgment: Judgment normal.     Results for orders placed or performed in visit on 10/01/22  Gamma GT  Result Value Ref Range   GGT 19 7 - 51 U/L      Assessment & Plan:   Problem  List Items Addressed This Visit       Respiratory   Asthma    Chronic, stable. Continue albuterol inhaler as needed. Follow up if symptoms worsen or start occurring more frequently.         Other   Chronic fatigue    Chronic, ongoing. She has had a sleep study which showed mild sleep apnea and bipap did not help with the fatigue. She has had multiple labs checked with the only abnormality being elevated estrogen and testosterone and low progesterone levels. Her TSH was slightly low in 12/2021. Will check thyroid panel, antibodies, and PTH levels. Encourage regular sleep, exercise and follow up in 3 months.       Relevant Orders   PTH, Intact and Calcium   Anemia    Most recent ferritin levels elevated. Will have her cut back on her iron supplement to once a week. She states her last menstrual period was spotting a few months ago.       MDD (major depressive disorder), recurrent severe, without psychosis ([place])    Currently following with a therapist and psychiatrist. Continue collaboration and recommendations.       Elevated LFTs  LFTs elevated consistently. Will check GGT and RUQ ultrasound.       Relevant Orders   Gamma GT (Completed)   PTH, Intact and Calcium   US Abdomen Limited RUQ (LIVER/GB)   Other Visit Diagnoses     Encounter for general adult medical examination with abnormal findings    -  Primary   Health maintenance reviewed and updated. Recent labs reviewed from 09/17/22. Follow-up 1 year.    B12 deficiency       B12 injection given today. If not having any improvement on fatigue, will stop after this injection.    Relevant Medications   cyanocobalamin (VITAMIN B12) injection 1,000 mcg (Completed)   Low TSH level       She had a low TSH level in 12/2021. Will repeat thyroid panel, and check TPO and TSI antibodies today.   Relevant Orders   Thyroid Peroxidase Antibodies (TPO) (REFL)   Thyroid Panel With TSH   Thyroid stimulating immunoglobulin   PTH, Intact  and Calcium        Follow up plan: Return in about 2 months (around 12/01/2022) for fatigue.   LABORATORY TESTING:  - Pap smear: done elsewhere  IMMUNIZATIONS:   - Tdap: Tetanus vaccination status reviewed: last tetanus booster within 10 years. - Influenza: Up to date - Pneumovax: Not applicable - Prevnar: Not applicable - HPV: Not applicable - Zostavax vaccine: Not applicable  SCREENING: -Mammogram: Done elsewhere  - Colonoscopy: Not applicable  - Bone Density: Not applicable  -Hearing Test: Not applicable  -Spirometry: Not applicable   PATIENT COUNSELING:   Advised to take 1 mg of folate supplement per day if capable of pregnancy.   Sexuality: Discussed sexually transmitted diseases, partner selection, use of condoms, avoidance of unintended pregnancy  and contraceptive alternatives.   Advised to avoid cigarette smoking.  I discussed with the patient that most people either abstain from alcohol or drink within safe limits (<=14/week and <=4 drinks/occasion for males, <=7/weeks and <= 3 drinks/occasion for females) and that the risk for alcohol disorders and other health effects rises proportionally with the number of drinks per week and how often a drinker exceeds daily limits.  Discussed cessation/primary prevention of drug use and availability of treatment for abuse.   Diet: Encouraged to adjust caloric intake to maintain  or achieve ideal body weight, to reduce intake of dietary saturated fat and total fat, to limit sodium intake by avoiding high sodium foods and not adding table salt, and to maintain adequate dietary potassium and calcium preferably from fresh fruits, vegetables, and low-fat dairy products.    stressed the importance of regular exercise  Injury prevention: Discussed safety belts, safety helmets, smoke detector, smoking near bedding or upholstery.   Dental health: Discussed importance of regular tooth brushing, flossing, and dental visits.    NEXT  PREVENTATIVE PHYSICAL DUE IN 1 YEAR. Return in about 2 months (around 12/01/2022) for fatigue.

## 2022-10-01 NOTE — Assessment & Plan Note (Addendum)
Chronic, ongoing. She has had a sleep study which showed mild sleep apnea and bipap did not help with the fatigue. She has had multiple labs checked with the only abnormality being elevated estrogen and testosterone and low progesterone levels. Her TSH was slightly low in 12/2021. Will check thyroid panel, antibodies, and PTH levels. Encourage regular sleep, exercise and follow up in 3 months.

## 2022-10-01 NOTE — Assessment & Plan Note (Signed)
Chronic, stable. Continue albuterol inhaler as needed. Follow up if symptoms worsen or start occurring more frequently.

## 2022-10-01 NOTE — Assessment & Plan Note (Signed)
Currently following with a therapist and psychiatrist. Continue collaboration and recommendations.

## 2022-10-01 NOTE — Telephone Encounter (Signed)
Noted  

## 2022-10-01 NOTE — Patient Instructions (Signed)
It was great to see you!  Cut back on your iron to once week.   We are checking your labs today and will let you know the results via mychart/phone.   I am also ordering an ultrasound of your liver, they will call to schedule.   Let's follow-up in 2 months, sooner if you have concerns.  If a referral was placed today, you will be contacted for an appointment. Please note that routine referrals can sometimes take up to 3-4 weeks to process. Please call our office if you haven't heard anything after this time frame.  Take care,  Vance Peper, NP

## 2022-10-01 NOTE — Assessment & Plan Note (Addendum)
Most recent ferritin levels elevated. Will have her cut back on her iron supplement to once a week. She states her last menstrual period was spotting a few months ago.

## 2022-10-02 ENCOUNTER — Encounter: Payer: Self-pay | Admitting: Nurse Practitioner

## 2022-10-07 ENCOUNTER — Encounter: Payer: Self-pay | Admitting: Nurse Practitioner

## 2022-10-07 DIAGNOSIS — R5382 Chronic fatigue, unspecified: Secondary | ICD-10-CM

## 2022-10-07 LAB — THYROID PEROXIDASE ANTIBODIES (TPO) (REFL): Thyroperoxidase Ab SerPl-aCnc: 1 IU/mL (ref <9)

## 2022-10-07 LAB — PTH, INTACT AND CALCIUM
Calcium: 9.4 mg/dL (ref 8.6–10.2)
PTH: 56 pg/mL (ref 16–77)

## 2022-10-07 LAB — THYROID PANEL WITH TSH
Free Thyroxine Index: 1.6 (ref 1.4–3.8)
T3 Uptake: 24 % (ref 22–35)
T4, Total: 6.8 ug/dL (ref 5.1–11.9)
TSH: 1.35 mIU/L

## 2022-10-07 LAB — THYROID STIMULATING IMMUNOGLOBULIN: TSI: <89 % baseline (ref <140)

## 2022-10-10 ENCOUNTER — Telehealth: Payer: Self-pay | Admitting: Nurse Practitioner

## 2022-10-10 ENCOUNTER — Encounter: Payer: Self-pay | Admitting: Nurse Practitioner

## 2022-10-10 NOTE — Addendum Note (Signed)
Addended by: Vance Peper A on: 10/10/2022 11:11 AM   Modules accepted: Orders

## 2022-10-10 NOTE — Telephone Encounter (Signed)
Caller Name: Rheumatology Call back phone #: [phone]  Reason for Call: Unable to see pt for only fatigue. Please update and resend if there are other issues

## 2022-10-11 ENCOUNTER — Ambulatory Visit (HOSPITAL_COMMUNITY)
Admission: RE | Admit: 2022-10-11 | Discharge: 2022-10-11 | Disposition: A | Discharge status: Home / Self Care | Payer: 59 | Source: Ambulatory Visit | Attending: Nurse Practitioner | Admitting: Nurse Practitioner

## 2022-10-11 DIAGNOSIS — R7989 Other specified abnormal findings of blood chemistry: Secondary | ICD-10-CM | POA: Diagnosis present

## 2022-12-03 ENCOUNTER — Encounter: Payer: Self-pay | Admitting: Nurse Practitioner

## 2022-12-03 ENCOUNTER — Ambulatory Visit (INDEPENDENT_AMBULATORY_CARE_PROVIDER_SITE_OTHER): Payer: 59 | Admitting: Nurse Practitioner

## 2022-12-03 ENCOUNTER — Other Ambulatory Visit: Payer: Self-pay | Admitting: Nurse Practitioner

## 2022-12-03 VITALS — BP 118/84 | HR 93 | Temp 98.4°F | Ht 66.0 in | Wt 249.8 lb

## 2022-12-03 DIAGNOSIS — J4530 Mild persistent asthma, uncomplicated: Secondary | ICD-10-CM | POA: Diagnosis not present

## 2022-12-03 DIAGNOSIS — K529 Noninfective gastroenteritis and colitis, unspecified: Secondary | ICD-10-CM | POA: Diagnosis not present

## 2022-12-03 DIAGNOSIS — R5382 Chronic fatigue, unspecified: Secondary | ICD-10-CM

## 2022-12-03 DIAGNOSIS — R0982 Postnasal drip: Secondary | ICD-10-CM

## 2022-12-03 LAB — COMPREHENSIVE METABOLIC PANEL
ALT: 20 U/L (ref 0–35)
AST: 15 U/L (ref 0–37)
Albumin: 4.2 g/dL (ref 3.5–5.2)
Alkaline Phosphatase: 101 U/L (ref 39–117)
BUN: 12 mg/dL (ref 6–23)
CO2: 29 mEq/L (ref 19–32)
Calcium: 9.3 mg/dL (ref 8.4–10.5)
Chloride: 105 mEq/L (ref 96–112)
Creatinine, Ser: 0.59 mg/dL (ref 0.40–1.20)
GFR: 105.69 mL/min (ref >60.00)
Glucose, Bld: 99 mg/dL (ref 70–99)
Potassium: 4.4 mEq/L (ref 3.5–5.1)
Sodium: 140 mEq/L (ref 135–145)
Total Bilirubin: 0.3 mg/dL (ref 0.2–1.2)
Total Protein: 6.9 g/dL (ref 6.0–8.3)

## 2022-12-03 MED ORDER — QVAR REDIHALER 40 MCG/ACT IN AERB: 2.0000 | INHALATION_SPRAY | Freq: Two times a day (BID) | RESPIRATORY_TRACT | 6 refills | Status: DC

## 2022-12-03 MED ORDER — IPRATROPIUM BROMIDE 0.03 % NA SOLN: 2.0000 | Freq: Two times a day (BID) | NASAL | 12 refills | Status: DC

## 2022-12-03 NOTE — Assessment & Plan Note (Signed)
Chronic, ongoing.  She has still been having multiple bowel movements per day.  Discussed increasing fiber in her diet and starting a fiber supplement like Metamucil or Benefiber.  If symptoms are still ongoing, will place referral to GI.

## 2022-12-03 NOTE — Progress Notes (Signed)
Established Patient Office Visit  Subjective   Patient ID: Maria Clark, female    DOB: [birth year]  Age: 51 y.o. MRN: [id number]  Chief Complaint  Patient presents with   Follow-up    Asthma worse. No other concerns. Not fasting.    HPI  Maria Clark is here to follow-up on asthma. She states that she has been needing to use her albuterol inhaler more frequently, especially at night. She endorses wheezing and some shortness of breath. She denies chest tightness. She was on qvar in the past. She has also been having post nasal drip. She has used ipratropium bromide in the past which worked well for her and she would like a refill.   She is still having some ongoing chronic fatigue.  She has been taking supplements including green vibrance, L-cysteine, and vitamin C supplement. She has also been having chronic diarrhea and goes to the bathroom at least 3 times a day. She denies abdominal pain, fever, and cramping.     ROS See pertinent positives and negatives per HPI.    Objective:     BP 118/84 (BP Location: Right Arm, Cuff Size: Large)   Pulse 93   Temp 98.4 F (36.9 C) (Oral)   Ht '5\' 6"'$  (1.676 m)   Wt 249 lb 12.8 oz (113.3 kg)   SpO2 95%   BMI 40.32 kg/m    Physical Exam Vitals and nursing note reviewed.  Constitutional:      General: She is not in acute distress.    Appearance: Normal appearance.  HENT:     Head: Normocephalic.  Eyes:     Conjunctiva/sclera: Conjunctivae normal.  Cardiovascular:     Rate and Rhythm: Normal rate and regular rhythm.     Pulses: Normal pulses.     Heart sounds: Normal heart sounds.  Pulmonary:     Effort: Pulmonary effort is normal.     Breath sounds: Normal breath sounds.  Musculoskeletal:     Cervical back: Normal range of motion.  Skin:    General: Skin is warm.  Neurological:     General: No focal deficit present.     Mental Status: She is alert and oriented to person, place, and time.   Psychiatric:        Mood and Affect: Mood normal.        Behavior: Behavior normal.        Thought Content: Thought content normal.        Judgment: Judgment normal.    The 10-year ASCVD risk score (Arnett DK, et al., 2019) is: 1%    Assessment & Plan:   Problem List Items Addressed This Visit       Respiratory   Asthma - Primary    Chronic, not controlled.  She has been needing to use her albuterol inhaler daily not several times a day.  Will restart her on Qvar inhaler 2 puffs twice a day.  Discussed rinsing her mouth or brushing her teeth after use.  She can continue to use the albuterol inhaler as needed.  Follow-up in 1 to 2 months.      Relevant Medications   beclomethasone (QVAR REDIHALER) 40 MCG/ACT inhaler     Digestive   Chronic diarrhea    Chronic, ongoing.  She has still been having multiple bowel movements per day.  Discussed increasing fiber in her diet and starting a fiber supplement like Metamucil or Benefiber.  If symptoms are still ongoing, will place referral to  GI.        Other   Chronic fatigue    She is still having ongoing fatigue, will check ANA and CMP today.  She can continue the supplements if they are helping.      Relevant Orders   ANA w/Reflex   Comprehensive metabolic panel (Completed)   Post-nasal drip    Her postnasal drip has worsened and she had taken ipratropium provide nasal spray in the past which helped.  Refill sent to the pharmacy.       Return in about 1 month (around 01/03/2023) for 1-2 months asthma.    Maria Dancer, NP

## 2022-12-03 NOTE — Assessment & Plan Note (Signed)
She is still having ongoing fatigue, will check ANA and CMP today.  She can continue the supplements if they are helping.

## 2022-12-03 NOTE — Assessment & Plan Note (Signed)
Chronic, not controlled.  She has been needing to use her albuterol inhaler daily not several times a day.  Will restart her on Qvar inhaler 2 puffs twice a day.  Discussed rinsing her mouth or brushing her teeth after use.  She can continue to use the albuterol inhaler as needed.  Follow-up in 1 to 2 months.

## 2022-12-03 NOTE — Patient Instructions (Signed)
It was great to see you!  Start qvar inhaler twice a day, rinse your mouth out after using. You can keep using your albuterol inhaler as needed.   Start increasing fiber in your diet or starting a supplement like benefiber or metamucil to help with the diarrhea. Make sure you are drinking plenty of water.   We are checking your labs today and will let you know the results via mychart/phone.   Let's follow-up in 1-2 months, sooner if you have concerns.  If a referral was placed today, you will be contacted for an appointment. Please note that routine referrals can sometimes take up to 3-4 weeks to process. Please call our office if you haven't heard anything after this time frame.  Take care,  Vance Peper, NP

## 2022-12-03 NOTE — Assessment & Plan Note (Signed)
Her postnasal drip has worsened and she had taken ipratropium provide nasal spray in the past which helped.  Refill sent to the pharmacy.

## 2022-12-04 ENCOUNTER — Encounter: Payer: Self-pay | Admitting: Nurse Practitioner

## 2022-12-04 ENCOUNTER — Other Ambulatory Visit (HOSPITAL_COMMUNITY): Payer: Self-pay

## 2022-12-04 ENCOUNTER — Other Ambulatory Visit: Payer: Self-pay

## 2022-12-04 LAB — ANA W/REFLEX: Anti Nuclear Antibody (ANA): NEGATIVE

## 2022-12-05 ENCOUNTER — Telehealth: Payer: Self-pay

## 2022-12-05 ENCOUNTER — Other Ambulatory Visit (HOSPITAL_COMMUNITY): Payer: Self-pay

## 2022-12-05 MED ORDER — ARNUITY ELLIPTA 50 MCG/ACT IN AEPB: 1.0000 | INHALATION_SPRAY | Freq: Every day | RESPIRATORY_TRACT | 1 refills | Status: DC

## 2022-12-05 NOTE — Telephone Encounter (Signed)
Pt spoke with her insurance company and they will cover Arnuity Ellipta. She is also going out of town tomorrow 12/05/22

## 2022-12-05 NOTE — Telephone Encounter (Signed)
Prior Maria Clark has been submitted and documented in a separate encounter.

## 2022-12-05 NOTE — Telephone Encounter (Signed)
Pharmacy Patient Advocate Encounter  Received notification from [employer] that the request for prior authorization for Qvar 21mg/act has been denied due to .    How would you like to proceed?  Please be advised appeals may take up to 5 business days to be submitted as pharmacist prepares necessary documentation.  Thank you!

## 2022-12-05 NOTE — Telephone Encounter (Signed)
I have submitted the prior auth to [employer] via Covermymeds. Waiting on response.

## 2022-12-05 NOTE — Telephone Encounter (Signed)
Patient Advocate Encounter   Received notification from [place] that prior authorization for QVAR 40MCG/ACT is required.   PA submitted on 12/05/2022 Key  [id number] Status is pending       Joneen Boers, [place] Patient Advocate Specialist [facility] Patient Advocate Team Direct Number: [phone] Fax: [phone]

## 2022-12-06 NOTE — Telephone Encounter (Signed)
Patient is aware of annotation below and verbalized understanding.  

## 2022-12-31 LAB — HEPATIC FUNCTION PANEL
ALT: 40 U/L — AB (ref 7–35)
AST: 25 (ref 13–35)
Alkaline Phosphatase: 115 (ref 25–125)

## 2022-12-31 LAB — CBC AND DIFFERENTIAL
HCT: 42 (ref 36–46)
Hemoglobin: 13.9 (ref 12.0–16.0)
Platelets: 311 10*3/uL (ref 150–400)
WBC: 8.5

## 2022-12-31 LAB — BASIC METABOLIC PANEL
BUN: 13 (ref 4–21)
CO2: 26 — AB (ref 13–22)
Chloride: 103 (ref 99–108)
Creatinine: 0.6 (ref 0.5–1.1)
Glucose: 97
Potassium: 4.7 mEq/L (ref 3.5–5.1)
Sodium: 142 (ref 137–147)

## 2022-12-31 LAB — COMPREHENSIVE METABOLIC PANEL: Calcium: 9.8 (ref 8.7–10.7)

## 2023-01-07 ENCOUNTER — Ambulatory Visit: Payer: No Typology Code available for payment source | Admitting: Nurse Practitioner

## 2023-01-07 ENCOUNTER — Encounter: Payer: Self-pay | Admitting: Nurse Practitioner

## 2023-01-07 VITALS — BP 124/82 | HR 93 | Ht 66.0 in | Wt 245.6 lb

## 2023-01-07 DIAGNOSIS — J4530 Mild persistent asthma, uncomplicated: Secondary | ICD-10-CM | POA: Diagnosis not present

## 2023-01-07 DIAGNOSIS — R1310 Dysphagia, unspecified: Secondary | ICD-10-CM | POA: Diagnosis not present

## 2023-01-07 DIAGNOSIS — F332 Major depressive disorder, recurrent severe without psychotic features: Secondary | ICD-10-CM | POA: Diagnosis not present

## 2023-01-07 MED ORDER — BUDESONIDE 90 MCG/ACT IN AEPB: 1.0000 | INHALATION_SPRAY | Freq: Two times a day (BID) | RESPIRATORY_TRACT | 3 refills | Status: DC

## 2023-01-07 NOTE — Patient Instructions (Addendum)
It was great to see you!  Switch from arnuity inhaler to pulmicort inhaler 1 puff twice a day. Rinse your mouth after using.   I have placed a referral to GI.   Let's follow-up in 6 months, sooner if you have concerns.  If a referral was placed today, you will be contacted for an appointment. Please note that routine referrals can sometimes take up to 3-4 weeks to process. Please call our office if you haven't heard anything after this time frame.  Take care,  Vance Peper, NP

## 2023-01-07 NOTE — Progress Notes (Signed)
Established Patient Office Visit  Subjective   Patient ID: Maria Clark, female    DOB: June 29, 1971  Age: 52 y.o. MRN: [id number]  Chief Complaint  Patient presents with   Follow-up     Follow up , having trouble with swallowing, food feels like it getting suck in her throat , she test done years but was neg, sometime burning in chest , and horsiness      HPI  Maria Clark is here to follow-up on asthma.   She states that her breathing has gotten a lot better since starting the arnuity ellipta inhaler. She has only needed to use her albuterol inhaler twice in the last month. She states that her health insurance has changed and the arnuity ellipta inhaler costs around $200 per month. She is wondering if there is anything less expensive that she can switch to.   She also notes that she has been having trouble swallowing for years. She states that she had a barium swallow which did not show anything. She states that it was happening if she was stressed or anxious. However, now it is happening more frequently and when she is not anxious. She states that it only happens with solid foods. She also has a hard time swallowing pills. She states that on Christmas, she was eating and food got stuck in her esophagus. She went to swallow water to help it down and the water was just sitting on top of the food. She then went to vomit in the bathroom, however on the way it finally moved down. She would like further testing and a possible EGD.     ROS See pertinent positives and negatives per HPI.    Objective:     BP 124/82   Pulse 93   Ht '5\' 6"'$  (1.676 m)   Wt 245 lb 9.6 oz (111.4 kg)   SpO2 95%   BMI 39.64 kg/m  BP Readings from Last 3 Encounters:  01/07/23 124/82  12/03/22 118/84  10/01/22 129/78   Wt Readings from Last 3 Encounters:  01/07/23 245 lb 9.6 oz (111.4 kg)  12/03/22 249 lb 12.8 oz (113.3 kg)  10/01/22 246 lb 12.8 oz (111.9 kg)      Physical  Exam Vitals and nursing note reviewed.  Constitutional:      General: She is not in acute distress.    Appearance: Normal appearance.  HENT:     Head: Normocephalic.  Eyes:     Conjunctiva/sclera: Conjunctivae normal.  Cardiovascular:     Rate and Rhythm: Normal rate and regular rhythm.     Pulses: Normal pulses.     Heart sounds: Normal heart sounds.  Pulmonary:     Effort: Pulmonary effort is normal.     Breath sounds: Normal breath sounds.  Musculoskeletal:     Cervical back: Normal range of motion and neck supple. No tenderness.  Lymphadenopathy:     Cervical: No cervical adenopathy.  Skin:    General: Skin is warm.  Neurological:     General: No focal deficit present.     Mental Status: She is alert and oriented to person, place, and time.  Psychiatric:        Mood and Affect: Mood normal.        Behavior: Behavior normal.        Thought Content: Thought content normal.        Judgment: Judgment normal.    The 10-year ASCVD risk score (Arnett DK, et al.,  2019) is: 1.1%    Assessment & Plan:   Problem List Items Addressed This Visit       Respiratory   Asthma    Chronic, stable. The arunuity ellipta is controlling her symptoms, however it is too expensive each month. Will switch her to pulmicort inhaler BID. Rinse mouth after using. She is up to date on pneumonia vaccine. Continue albuterol as needed. Follow-up in 6 months or sooner with concerns.       Relevant Medications   Budesonide 90 MCG/ACT inhaler     Digestive   Dysphagia - Primary    Chronic, not controlled. Her dysphagia has worsened recently and she had food stuck in her esophagus for several minutes over Christmas. She states that she has had a barium swallow in the past which was normal. She is interested in further evaluation for this. Will place referral to GI.       Relevant Orders   Ambulatory referral to Gastroenterology     Other   MDD (major depressive disorder), recurrent severe,  without psychosis ([name])    Chronic, stable. Continue following with therapist and psychiatrist.       Relevant Medications   ALPRAZolam (XANAX) 0.5 MG tablet    Return in about 6 months (around 07/08/2023) for asthma.    Charyl Dancer, NP

## 2023-01-07 NOTE — Assessment & Plan Note (Signed)
Chronic, not controlled. Her dysphagia has worsened recently and she had food stuck in her esophagus for several minutes over Christmas. She states that she has had a barium swallow in the past which was normal. She is interested in further evaluation for this. Will place referral to GI.

## 2023-01-07 NOTE — Assessment & Plan Note (Signed)
Chronic, stable. The arunuity ellipta is controlling her symptoms, however it is too expensive each month. Will switch her to pulmicort inhaler BID. Rinse mouth after using. She is up to date on pneumonia vaccine. Continue albuterol as needed. Follow-up in 6 months or sooner with concerns.

## 2023-01-07 NOTE — Assessment & Plan Note (Signed)
Chronic, stable. Continue following with therapist and psychiatrist.

## 2023-01-09 ENCOUNTER — Encounter: Payer: Self-pay | Admitting: Nurse Practitioner

## 2023-01-09 DIAGNOSIS — J4530 Mild persistent asthma, uncomplicated: Secondary | ICD-10-CM

## 2023-01-09 MED ORDER — ALVESCO 80 MCG/ACT IN AERS: 1.0000 | INHALATION_SPRAY | Freq: Two times a day (BID) | RESPIRATORY_TRACT | 3 refills | Status: DC

## 2023-01-09 NOTE — Addendum Note (Signed)
Addended by: Vance Peper A on: 01/09/2023 02:39 PM   Modules accepted: Orders

## 2023-01-10 MED ORDER — BUDESONIDE 90 MCG/ACT IN AEPB: 1.0000 | INHALATION_SPRAY | Freq: Two times a day (BID) | RESPIRATORY_TRACT | 3 refills | Status: DC

## 2023-01-10 NOTE — Addendum Note (Signed)
Addended by: Vance Peper A on: 01/10/2023 01:04 PM   Modules accepted: Orders

## 2023-01-15 ENCOUNTER — Other Ambulatory Visit (HOSPITAL_COMMUNITY): Payer: Self-pay

## 2023-01-15 ENCOUNTER — Telehealth: Payer: Self-pay | Admitting: Internal Medicine

## 2023-01-15 NOTE — Addendum Note (Signed)
Addended by: Alphonzo Lemmings on: 01/15/2023 11:48 AM   Modules accepted: Orders

## 2023-01-15 NOTE — Telephone Encounter (Signed)
Hey Dr. Lorenso Courier,  This patient recently moved here from [place] and is being referred here by NP Vance Peper for difficulty swallowing and establishing care,  Patient prefers a female provider.  Her records are being sent to you for your consideration.   Please let me know how you would like to proceed.  Thank you.

## 2023-01-15 NOTE — Telephone Encounter (Signed)
Lft VM to rtn call. Dm/cma  

## 2023-01-16 ENCOUNTER — Encounter: Payer: Self-pay | Admitting: Internal Medicine

## 2023-01-16 NOTE — Progress Notes (Signed)
Reviewed records from [facility]. Okay to schedule for OV  Colonoscopy 11/09/18: Indication is IDA. 2 polyps in the ascending colon removed with cold biopsy forceps. Sigmoid diverticulosis. Internal hemorrhoids. Good prep. Follow up in 5 years.  Path: [facility] and [facility]

## 2023-01-17 ENCOUNTER — Telehealth: Payer: Self-pay

## 2023-01-17 NOTE — Progress Notes (Unsigned)
   01/17/2023 Name: Maria Clark MRN: [id number] DOB: May 16, 1971  Maria Clark is a 52 y.o. year old female who presented for a telephone visit.   They were referred to the pharmacist by their PCP for assistance in managing medication access.   Patient is participating in a Managed Medicaid Plan:  No- does not qualify  Subjective: Patient referred to pharmacy because she currently does not have prescription insurance and is need of an affordable long-acting asthma inhaler. Care Team: Primary Care Provider: Charyl Dancer, NP ; Next Scheduled Visit: 07/08/23  Medication Access/Adherence Patient reports affordability concerns with their medications: Yes  Patient reports access/transportation concerns to their pharmacy: No  Patient reports adherence concerns with their medications:  Yes  Currently out of maintenance inhaler for asthma control due to cost.  Objective:  Lab Results  Component Value Date   HGBA1C 5.3 09/17/2022   Lab Results  Component Value Date   CREATININE 0.6 12/31/2022   BUN 13 12/31/2022   NA 142 12/31/2022   K 4.7 12/31/2022   CL 103 12/31/2022   CO2 26 (A) 12/31/2022   Lab Results  Component Value Date   CHOL 142 06/26/2021   HDL 37.10 (L) 06/26/2021   LDLCALC 81 06/26/2021   TRIG 120.0 06/26/2021   CHOLHDL 4 06/26/2021   Assessment/Plan:   Medication Management: - Currently strategy insufficient to maintain appropriate adherence to prescribed medication regimen - Patient previously prescribed Arunity and Pulmicort, but these were going to be over $200 a month with her Good Rx Gold card - Evaluated current generics for ashtma maintenance and cost on [facility] at [facility] to determine fluticasone/salmeterol 250/50 would be $88 /month at [facility].  This is doable for patient at this time. -She is currently without maintenance inhaler but has been for a few days, and she has rescue inhaler on hand.  States she will  be okay till Ander Purpura can sign order upon her return next week.  Order is pending. -In the mean time I will research if we can find a cheaper, more long-term resolution for her.  Follow Up Plan: Look into cheaper, more long-term resolution and will follow back up with patient and provider.  Darlina Guys, PharmD, DPLA

## 2023-01-21 ENCOUNTER — Other Ambulatory Visit: Payer: Self-pay | Admitting: Nurse Practitioner

## 2023-01-21 MED ORDER — FLUTICASONE-SALMETEROL 100-50 MCG/ACT IN AEPB: 1.0000 | INHALATION_SPRAY | Freq: Two times a day (BID) | RESPIRATORY_TRACT | 3 refills | Status: DC

## 2023-01-21 NOTE — Progress Notes (Signed)
Advised by pharmacist that generic advair discus is a more affordable option at [facility]. Order placed.

## 2023-01-22 ENCOUNTER — Telehealth: Payer: Self-pay

## 2023-01-22 NOTE — Telephone Encounter (Signed)
   01/22/2023 Name: Maria Clark MRN: [id number] DOB: 1971-07-27  Chief Complaint  Patient presents with   Medication Assistance   Maria Clark is a 52 y.o. year old female who presented for a telephone visit.   They were referred to the pharmacist by their PCP for assistance in managing medication access.   Patient is participating in a Managed Medicaid Plan:  No  Subjective/Objective: Follow-up phone call to inform patient a script for generic Advair had been sent to the requested [place].  Also, collected financial information to determine eligibility for patient assistance. Care Team: Primary Care Provider: Charyl Dancer, NP ; Next Scheduled Visit: 06/2323  Medication Access/Adherence Patient reports affordability concerns with their medications: Yes  Patient reports access/transportation concerns to their pharmacy: No  Patient reports adherence concerns with their medications:  Yes  Without maintenance inhaler due to cost.  Assessment/Plan:  - Based on current, patient-reported gross monthly income, she will not qualify for patient assistance - Did find inhaler she is currently prescribed on [facility] for $62 - I can also refer patient to pharmacies that will allow charge accounts for her to make regular installments if that would be beneficial  Follow Up Plan: Sent MyChart message summarizing some available options and included my phone number for any questions or further assistance.  Darlina Guys, PharmD, DPLA

## 2023-01-28 ENCOUNTER — Telehealth: Payer: Self-pay

## 2023-01-28 NOTE — Progress Notes (Signed)
   01/28/2023  Patient ID: Maria Clark, female   DOB: August 22, 1971, 52 y.o.   MRN: [id number]  Outreach attempt to see how generic Advair diskus is working for patient.  She left me a voicemail last week to inform me she was signing up for commercial insurance.  Informed in voicemail to let me know if this product is not covered on her new formulary, so we can find another option for her.  Darlina Guys, PharmD, DPLA

## 2023-02-18 DIAGNOSIS — D2372 Other benign neoplasm of skin of left lower limb, including hip: Secondary | ICD-10-CM | POA: Diagnosis not present

## 2023-02-18 DIAGNOSIS — D1801 Hemangioma of skin and subcutaneous tissue: Secondary | ICD-10-CM | POA: Diagnosis not present

## 2023-03-03 ENCOUNTER — Encounter: Payer: Self-pay | Admitting: Internal Medicine

## 2023-03-03 ENCOUNTER — Ambulatory Visit (INDEPENDENT_AMBULATORY_CARE_PROVIDER_SITE_OTHER): Payer: 59 | Admitting: Internal Medicine

## 2023-03-03 VITALS — BP 120/70 | HR 81 | Ht 66.0 in | Wt 261.0 lb

## 2023-03-03 DIAGNOSIS — K219 Gastro-esophageal reflux disease without esophagitis: Secondary | ICD-10-CM

## 2023-03-03 DIAGNOSIS — Z86010 Personal history of colonic polyps: Secondary | ICD-10-CM

## 2023-03-03 DIAGNOSIS — R1310 Dysphagia, unspecified: Secondary | ICD-10-CM

## 2023-03-03 MED ORDER — NA SULFATE-K SULFATE-MG SULF 17.5-3.13-1.6 GM/177ML PO SOLN: ORAL | 0 refills | Status: DC

## 2023-03-03 NOTE — Progress Notes (Signed)
Chief Complaint: Dysphagia  HPI : 52 year old female with history of asthma, obesity, endometriosis, and depression presents with dysphagia  She has had dysphagia for most of her life, but her dysphagia has been progressively worsening over time. Even at her last Christmas dinner taking her first bite of food, she will feel food gets stuck. When she tries to drink something, she can feel liquid just sitting in her throat. She felt herself about to retch at some point and tried to prevent herself from regurgitating.  As a result she thinks that she aspirated some liquid. She has had one other scare like that since 11/2022. Endorses dysphagia to solids only. Denies dysphagia to liquids.  The dysphagia occurs in her lower throat.  Denies N&V. Endorses chest burning and regurgitation. She doesn't take any medications for GERD. Denies abdominal pain or odynophagia. Weight has been stable. Denies blood in stools. She has had diarrhea in the past for which she take Welchol. On average she has 1-2 BMs per day. Mother has had dilation of her esophagus many years ago, and father has Barrett's esophagus. Denies prior EGD. Barium swallow study was done 20 years ago that was normal.    Past Medical History:  Diagnosis Date   Anemia    Asthma    Complication of anesthesia    woozy out of it for more than a day previously   Depression    Endometriosis    Mental disorder    Depression   Sleep apnea      Past Surgical History:  Procedure Laterality Date   ANTERIOR CRUCIATE LIGAMENT REPAIR     right x 2, left x 1   CHOLECYSTECTOMY     LAPAROSCOPY     Family History  Problem Relation Age of Onset   Hypertension Mother    Arthritis Mother    Benign prostatic hyperplasia Father    Aneurysm Paternal Aunt        cardiac - non-smoker   Aneurysm Paternal Grandfather        stomach - smoker   Polycystic ovary syndrome Neg Hx    Social History   Tobacco Use   Smoking status: Never    Passive  exposure: Never   Smokeless tobacco: Never  Vaping Use   Vaping Use: Never used  Substance Use Topics   Alcohol use: Yes    Comment: rarely   Drug use: Never   Current Outpatient Medications  Medication Sig Dispense Refill   albuterol (VENTOLIN HFA) 108 (90 Base) MCG/ACT inhaler Inhale 2 puffs into the lungs every 6 (six) hours as needed for wheezing or shortness of breath. 1 each 1   ALPRAZolam (XANAX) 0.5 MG tablet Take 0.5 mg by mouth daily as needed.     b complex vitamins capsule Take 1 capsule by mouth daily.     buPROPion (WELLBUTRIN XL) 150 MG 24 hr tablet TAKE 2 TABLETS (300 MG TOTAL) BY MOUTH DAILY. 180 tablet 3   Carboxymethylcellul-Glycerin (LUBRICATING EYE DROPS OP) Place 1 drop into both eyes daily as needed (dry eyes).     cariprazine (VRAYLAR) 1.5 MG capsule Take 1.5 mg by mouth daily.     Cholecalciferol (VITAMIN D) 50 MCG (2000 UT) tablet Take 2,000 Units by mouth daily.     colesevelam (WELCHOL) 625 MG tablet Take 1,250 mg by mouth 2 (two) times daily with a meal.     cyclobenzaprine (FLEXERIL) 10 MG tablet Take 1 tablet (10 mg total) by mouth 3 (  three) times daily as needed for muscle spasms. 30 tablet 2   escitalopram (LEXAPRO) 20 MG tablet Take 1 tablet by mouth daily.     fluticasone-salmeterol (ADVAIR) 100-50 MCG/ACT AEPB Inhale 1 puff into the lungs 2 (two) times daily. 60 each 3   hydrOXYzine (ATARAX) 25 MG tablet TAKE 3 TABLETS 3 TIMES A DAY AS NEEDED FOR UP TO 10 DAYS     ibuprofen (ADVIL) 200 MG tablet Take 400 mg by mouth every 6 (six) hours as needed for headache or moderate pain.     ipratropium (ATROVENT) 0.03 % nasal spray Place 2 sprays into both nostrils every 12 (twelve) hours. 30 mL 12   rOPINIRole (REQUIP) 1 MG tablet Take 1 mg by mouth at bedtime.     Solriamfetol HCl (SUNOSI) 150 MG TABS Take 150 mg by mouth daily.     Suvorexant (BELSOMRA) 20 MG TABS Take 20 mg by mouth at bedtime.     No current facility-administered medications for this visit.    Allergies  Allergen Reactions   Dust Mite Extract Other (See Comments) and Swelling    Client reports "dust makes my asthma worse" it can increase wheezing..     Review of Systems: All systems reviewed and negative except where noted in HPI.   Physical Exam: BP 120/70   Pulse 81   Ht 5\' 6"  (1.676 m)   Wt 261 lb (118.4 kg)   BMI 42.13 kg/m  Constitutional: Pleasant,well-developed, female in no acute distress. HEENT: Normocephalic and atraumatic. Conjunctivae are normal. No scleral icterus. Cardiovascular: Normal rate, regular rhythm.  Pulmonary/chest: Effort normal and breath sounds normal. No wheezing, rales or rhonchi. Abdominal: Soft, nondistended, nontender. Bowel sounds active throughout. There are no masses palpable. No hepatomegaly. Extremities: No edema Neurological: Alert and oriented to person place and time. Skin: Skin is warm and dry. No rashes noted. Psychiatric: Normal mood and affect. Behavior is normal.  Labs 12/2022: CBC unremarkable. CMP with mildly elevated ALT of 40.   Colonoscopy 11/09/18: Indication is IDA. 2 polyps in the ascending colon removed with cold biopsy forceps. Sigmoid diverticulosis. Internal hemorrhoids. Good prep. Follow up in 5 years.  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  She has never had an EGD in the past.  Will plan to proceed with EGD for further evaluation of her dysphagia.  She does have history of GERD which would put her at risk for development of a peptic stricture and asthma that would increase her risk for having eosinophilic esophagitis.  I did go over conservative measures to help with her acid reflux.  Patient is due for polyp surveillance as well so we will perform a colonoscopy at the same time as her EGD procedure. - GERD  handout - EGD/colonoscopy [facility]  Christia Reading, MD  I spent 45 minutes of time, including in depth chart review, independent review of results as outlined above, communicating results with the patient directly, face-to-face time with the patient, coordinating care, ordering studies and medications as appropriate, and documentation.

## 2023-03-03 NOTE — Patient Instructions (Signed)
You have been scheduled for an endoscopy and colonoscopy. Please follow the written instructions given to you at your visit today. Please pick up your prep supplies at the pharmacy within the next 1-3 days. If you use inhalers (even only as needed), please bring them with you on the day of your procedure.   We have sent the following medications to your pharmacy for you to pick up at your convenience: Suprep  If your blood pressure at your visit was 140/90 or greater, please contact your primary care physician to follow up on this.  _______________________________________________________  If you are age 26 or older, your body mass index should be between 23-30. Your Body mass index is 42.13 kg/m. If this is out of the aforementioned range listed, please consider follow up with your Primary Care Provider.  If you are age 29 or younger, your body mass index should be between 19-25. Your Body mass index is 42.13 kg/m. If this is out of the aformentioned range listed, please consider follow up with your Primary Care Provider.   ________________________________________________________  The [facility] providers would like to encourage you to use [facility] to communicate with providers for non-urgent requests or questions.  Due to long hold times on the telephone, sending your provider a message by [facility] may be a faster and more efficient way to get a response.  Please allow 48 business hours for a response.  Please remember that this is for non-urgent requests.  _______________________________________________________   Due to recent changes in healthcare laws, you may see the results of your imaging and laboratory studies on MyChart before your provider has had a chance to review them.  We understand that in some cases there may be results that are confusing or concerning to you. Not all laboratory results come back in the same time frame and the provider may be waiting for multiple results in order  to interpret others.  Please give [place] 48 hours in order for your provider to thoroughly review all the results before contacting the office for clarification of your results.    Thank you for entrusting me with your care and for choosing [facility], Dr. Christia Reading

## 2023-03-18 DIAGNOSIS — F330 Major depressive disorder, recurrent, mild: Secondary | ICD-10-CM | POA: Diagnosis not present

## 2023-03-18 DIAGNOSIS — G2401 Drug induced subacute dyskinesia: Secondary | ICD-10-CM | POA: Diagnosis not present

## 2023-03-18 DIAGNOSIS — G4733 Obstructive sleep apnea (adult) (pediatric): Secondary | ICD-10-CM | POA: Diagnosis not present

## 2023-03-18 DIAGNOSIS — G4710 Hypersomnia, unspecified: Secondary | ICD-10-CM | POA: Diagnosis not present

## 2023-03-18 DIAGNOSIS — F411 Generalized anxiety disorder: Secondary | ICD-10-CM | POA: Diagnosis not present

## 2023-04-02 DIAGNOSIS — F321 Major depressive disorder, single episode, moderate: Secondary | ICD-10-CM | POA: Diagnosis not present

## 2023-04-09 DIAGNOSIS — F321 Major depressive disorder, single episode, moderate: Secondary | ICD-10-CM | POA: Diagnosis not present

## 2023-04-10 ENCOUNTER — Encounter: Payer: Self-pay | Admitting: Internal Medicine

## 2023-04-10 ENCOUNTER — Ambulatory Visit (AMBULATORY_SURGERY_CENTER): Payer: 59 | Admitting: Internal Medicine

## 2023-04-10 VITALS — BP 112/93 | HR 73 | Temp 98.0°F | Resp 15 | Ht 66.0 in | Wt 261.0 lb

## 2023-04-10 DIAGNOSIS — R1310 Dysphagia, unspecified: Secondary | ICD-10-CM | POA: Diagnosis not present

## 2023-04-10 DIAGNOSIS — K219 Gastro-esophageal reflux disease without esophagitis: Secondary | ICD-10-CM | POA: Diagnosis not present

## 2023-04-10 DIAGNOSIS — Z86010 Personal history of colonic polyps: Secondary | ICD-10-CM

## 2023-04-10 DIAGNOSIS — K319 Disease of stomach and duodenum, unspecified: Secondary | ICD-10-CM | POA: Diagnosis not present

## 2023-04-10 DIAGNOSIS — K2970 Gastritis, unspecified, without bleeding: Secondary | ICD-10-CM | POA: Diagnosis not present

## 2023-04-10 DIAGNOSIS — G4730 Sleep apnea, unspecified: Secondary | ICD-10-CM | POA: Diagnosis not present

## 2023-04-10 DIAGNOSIS — Z09 Encounter for follow-up examination after completed treatment for conditions other than malignant neoplasm: Secondary | ICD-10-CM

## 2023-04-10 DIAGNOSIS — F419 Anxiety disorder, unspecified: Secondary | ICD-10-CM | POA: Diagnosis not present

## 2023-04-10 DIAGNOSIS — K209 Esophagitis, unspecified without bleeding: Secondary | ICD-10-CM | POA: Diagnosis not present

## 2023-04-10 DIAGNOSIS — D122 Benign neoplasm of ascending colon: Secondary | ICD-10-CM

## 2023-04-10 DIAGNOSIS — F32A Depression, unspecified: Secondary | ICD-10-CM | POA: Diagnosis not present

## 2023-04-10 DIAGNOSIS — D123 Benign neoplasm of transverse colon: Secondary | ICD-10-CM

## 2023-04-10 DIAGNOSIS — D125 Benign neoplasm of sigmoid colon: Secondary | ICD-10-CM

## 2023-04-10 MED ORDER — SODIUM CHLORIDE 0.9 % IV SOLN: 500.0000 mL | Freq: Once | INTRAVENOUS | Status: DC

## 2023-04-10 MED ORDER — PANTOPRAZOLE SODIUM 40 MG PO TBEC: 40.0000 mg | DELAYED_RELEASE_TABLET | Freq: Two times a day (BID) | ORAL | 2 refills | Status: DC

## 2023-04-10 NOTE — Progress Notes (Signed)
Called to room to assist during endoscopic procedure.  Patient ID and intended procedure confirmed with present staff. Received instructions for my participation in the procedure from the performing physician.  

## 2023-04-10 NOTE — Progress Notes (Signed)
Pt's states no medical or surgical changes since previsit or office visit. 

## 2023-04-10 NOTE — Progress Notes (Signed)
GASTROENTEROLOGY PROCEDURE H&P NOTE   Primary Care Physician: Gerre Scull, NP    Reason for Procedure:   Dysphagia, GERD, history of colon polyps  Plan:    EGD/colonoscopy  Patient is appropriate for endoscopic procedure(s) in the ambulatory ([facility]) setting.  The nature of the procedure, as well as the risks, benefits, and alternatives were carefully and thoroughly reviewed with the patient. Ample time for discussion and questions allowed. The patient understood, was satisfied, and agreed to proceed.     HPI: Maria Clark is a 52 y.o. female who presents for EGD/colonoscopy for evaluation of dysphagia, GERD, and history of colon polyps .  Patient was most recently seen in the Gastroenterology Clinic on 03/03/23.  No interval change in medical history since that appointment. Please refer to that note for full details regarding GI history and clinical presentation.   Past Medical History:  Diagnosis Date   Anemia    Anxiety    Asthma    Complication of anesthesia    woozy out of it for more than a day previously   Depression    Endometriosis    Epstein Barr infection    Mental disorder    Depression   Sleep apnea     Past Surgical History:  Procedure Laterality Date   ANTERIOR CRUCIATE LIGAMENT REPAIR     right x 2, left x 1   CHOLECYSTECTOMY     LAPAROSCOPY     to determine endometriosis    Prior to Admission medications   Medication Sig Start Date End Date Taking? Authorizing Provider  albuterol (VENTOLIN HFA) 108 (90 Base) MCG/ACT inhaler Inhale 2 puffs into the lungs every 6 (six) hours as needed for wheezing or shortness of breath. 12/12/21   Tower, Audrie Gallus, MD  ALPRAZolam Prudy Feeler) 0.5 MG tablet Take 0.5 mg by mouth daily as needed. 11/12/22   [provider]  b complex vitamins capsule Take 1 capsule by mouth daily.    [provider]  buPROPion (WELLBUTRIN XL) 150 MG 24 hr tablet TAKE 2 TABLETS (300 MG TOTAL) BY MOUTH DAILY.  08/06/21   Reva Bores, MD  Carboxymethylcellul-Glycerin (LUBRICATING EYE DROPS OP) Place 1 drop into both eyes daily as needed (dry eyes).    [provider]  cariprazine (VRAYLAR) 1.5 MG capsule Take 1.5 mg by mouth daily.    [provider]  Cholecalciferol (VITAMIN D) 50 MCG (2000 UT) tablet Take 2,000 Units by mouth daily.    [provider]  colesevelam (WELCHOL) 625 MG tablet Take 1,250 mg by mouth 2 (two) times daily with a meal.    [provider]  cyclobenzaprine (FLEXERIL) 10 MG tablet Take 1 tablet (10 mg total) by mouth 3 (three) times daily as needed for muscle spasms. 09/04/22   McElwee, Lauren A, NP  escitalopram (LEXAPRO) 20 MG tablet Take 1 tablet by mouth daily. 08/02/22   [provider]  fluticasone-salmeterol (ADVAIR) 100-50 MCG/ACT AEPB Inhale 1 puff into the lungs 2 (two) times daily. 01/21/23   McElwee, Lauren A, NP  hydrOXYzine (ATARAX) 25 MG tablet TAKE 3 TABLETS 3 TIMES A DAY AS NEEDED FOR UP TO 10 DAYS 08/06/22   [provider]  ibuprofen (ADVIL) 200 MG tablet Take 400 mg by mouth every 6 (six) hours as needed for headache or moderate pain.    [provider]  ipratropium (ATROVENT) 0.03 % nasal spray Place 2 sprays into both nostrils every 12 (twelve) hours. 12/03/22  McElwee, Lauren A, NP  Na Sulfate-K Sulfate-Mg Sulf 17.5-3.13-1.6 GM/177ML SOLN Use as directed; may use generic; goodrx card if insurance will not cover generic 03/03/23   Imogene Burn, MD  rOPINIRole (REQUIP) 1 MG tablet Take 1 mg by mouth at bedtime. 04/02/22   [provider]  Solriamfetol HCl (SUNOSI) 150 MG TABS Take 150 mg by mouth daily.    [provider]  Suvorexant (BELSOMRA) 20 MG TABS Take 20 mg by mouth at bedtime.    [provider]    Current Outpatient Medications  Medication Sig Dispense Refill   albuterol (VENTOLIN HFA) 108 (90 Base) MCG/ACT inhaler Inhale 2 puffs into the lungs every 6 (six) hours  as needed for wheezing or shortness of breath. 1 each 1   ALPRAZolam (XANAX) 0.5 MG tablet Take 0.5 mg by mouth daily as needed.     b complex vitamins capsule Take 1 capsule by mouth daily.     buPROPion (WELLBUTRIN XL) 150 MG 24 hr tablet TAKE 2 TABLETS (300 MG TOTAL) BY MOUTH DAILY. 180 tablet 3   Carboxymethylcellul-Glycerin (LUBRICATING EYE DROPS OP) Place 1 drop into both eyes daily as needed (dry eyes).     cariprazine (VRAYLAR) 1.5 MG capsule Take 1.5 mg by mouth daily.     Cholecalciferol (VITAMIN D) 50 MCG (2000 UT) tablet Take 2,000 Units by mouth daily.     colesevelam (WELCHOL) 625 MG tablet Take 1,250 mg by mouth 2 (two) times daily with a meal.     cyclobenzaprine (FLEXERIL) 10 MG tablet Take 1 tablet (10 mg total) by mouth 3 (three) times daily as needed for muscle spasms. 30 tablet 2   escitalopram (LEXAPRO) 20 MG tablet Take 1 tablet by mouth daily.     fluticasone-salmeterol (ADVAIR) 100-50 MCG/ACT AEPB Inhale 1 puff into the lungs 2 (two) times daily. 60 each 3   hydrOXYzine (ATARAX) 25 MG tablet TAKE 3 TABLETS 3 TIMES A DAY AS NEEDED FOR UP TO 10 DAYS     ibuprofen (ADVIL) 200 MG tablet Take 400 mg by mouth every 6 (six) hours as needed for headache or moderate pain.     ipratropium (ATROVENT) 0.03 % nasal spray Place 2 sprays into both nostrils every 12 (twelve) hours. 30 mL 12   Na Sulfate-K Sulfate-Mg Sulf 17.5-3.13-1.6 GM/177ML SOLN Use as directed; may use generic; goodrx card if insurance will not cover generic 354 mL 0   rOPINIRole (REQUIP) 1 MG tablet Take 1 mg by mouth at bedtime.     Solriamfetol HCl (SUNOSI) 150 MG TABS Take 150 mg by mouth daily.     Suvorexant (BELSOMRA) 20 MG TABS Take 20 mg by mouth at bedtime.     Current Facility-Administered Medications  Medication Dose Route Frequency Provider Last Rate Last Admin   0.9 %  sodium chloride infusion  500 mL Intravenous Once Imogene Burn, MD        Allergies as of 04/10/2023 - Review Complete 04/10/2023   Allergen Reaction Noted   Dust mite extract Other (See Comments) and Swelling 07/04/2022    Family History  Problem Relation Age of Onset   Hypertension Mother    Arthritis Mother    Benign prostatic hyperplasia Father    Aneurysm Paternal Aunt        cardiac - non-smoker   Aneurysm Paternal Grandfather        stomach - smoker   Polycystic ovary syndrome Neg Hx    Colon cancer Neg Hx  Esophageal cancer Neg Hx    Rectal cancer Neg Hx    Stomach cancer Neg Hx     Social History   Socioeconomic History   Marital status: Legally Separated    Spouse name: Jae Dire   Number of children: 0   Years of education: Master's Degree   Highest education level: Not on file  Occupational History   Not on file  Tobacco Use   Smoking status: Never    Passive exposure: Never   Smokeless tobacco: Never  Vaping Use   Vaping Use: Never used  Substance and Sexual Activity   Alcohol use: Yes    Comment: rarely   Drug use: Never   Sexual activity: Yes    Comment: female partners  Other Topics Concern   Not on file  Social History Narrative      Social Determinants of Health   Financial Resource Strain: Not on file  Food Insecurity: Not on file  Transportation Needs: Not on file  Physical Activity: Not on file  Stress: Not on file  Social Connections: Not on file  Intimate Partner Violence: Not on file    Physical Exam: Vital signs in last 24 hours: BP 136/78   Pulse 73   Temp 98 F (36.7 C) (Temporal)   Ht  (1.676 m)   Wt 261 lb (118.4 kg)   SpO2 96%   BMI 42.13 kg/m  GEN: NAD EYE: Sclerae anicteric ENT: MMM CV: Non-tachycardic Pulm: No increased WOB GI: Soft NEURO:  Alert & Oriented   Eulah Pont, MD [facility]   04/10/2023 1:29 PM'

## 2023-04-10 NOTE — Op Note (Signed)
[facility] Patient Name: Maria Clark Procedure Date: 04/10/2023 1:02 PM MRN: [id number] Endoscopist: Particia Lather , , [id number] Age: 52 Referring MD:  Date of Birth: [birth year] Gender: Female Account #: [id number] Procedure:                Upper GI endoscopy Indications:              Dysphagia Medicines:                Monitored Anesthesia Care Procedure:                Pre-Anesthesia Assessment:                           - Prior to the procedure, a History and Physical                            was performed, and patient medications and                            allergies were reviewed. The patient's tolerance of                            previous anesthesia was also reviewed. The risks                            and benefits of the procedure and the sedation                            options and risks were discussed with the patient.                            All questions were answered, and informed consent                            was obtained. Prior Anticoagulants: The patient has                            taken no anticoagulant or antiplatelet agents. ASA                            Grade Assessment: II - A patient with mild systemic                            disease. After reviewing the risks and benefits,                            the patient was deemed in satisfactory condition to                            undergo the procedure.                           After obtaining informed consent, the endoscope was  passed under direct vision. Throughout the                            procedure, the patient's blood pressure, pulse, and                            oxygen saturations were monitored continuously. The                            GIF [id number] #[id number] was introduced through the                            mouth, and advanced to the second part of duodenum.                            The upper GI endoscopy was  accomplished without                            difficulty. The patient tolerated the procedure                            well. Scope In: Scope Out: Findings:                 LA Grade B (one or more mucosal breaks greater than                            5 mm, not extending between the tops of two mucosal                            folds) esophagitis with no bleeding was found in                            the distal esophagus. Biopsies were taken                            throughout the esophagus with a cold forceps for                            histology.                           Localized moderate inflammation characterized by                            congestion (edema), erosions and erythema was found                            in the gastric antrum. Biopsies were taken with a                            cold forceps for histology.                           The examined duodenum was  normal. Complications:            No immediate complications. Estimated Blood Loss:     Estimated blood loss was minimal. Impression:               - LA Grade B esophagitis with no bleeding. Biopsied.                           - Gastritis. Biopsied.                           - Normal examined duodenum. Recommendation:           - Await pathology results.                           - Use Protonix (pantoprazole) 40 mg PO BID for 10                            weeks.                           - Return to GI clinic in 2 months.                           - Perform a colonoscopy today. Dr Particia Lather "Alan Ripper" [place],  04/10/2023 2:19:02 PM

## 2023-04-10 NOTE — Progress Notes (Signed)
Uneventful anesthetic. Report to pacu rn. Vss. Care resumed by rn. 

## 2023-04-10 NOTE — Op Note (Signed)
[facility] Patient Name: Maria Clark Procedure Date: 04/10/2023 1:01 PM MRN: [id number] Endoscopist: Particia Lather , , [phone] Age: 52 Referring MD:  Date of Birth: [birth year] Gender: Female Account #: [id number] Procedure:                Colonoscopy Indications:              High risk colon cancer surveillance: Personal                            history of colonic polyps Medicines:                Monitored Anesthesia Care Procedure:                Pre-Anesthesia Assessment:                           - Prior to the procedure, a History and Physical                            was performed, and patient medications and                            allergies were reviewed. The patient's tolerance of                            previous anesthesia was also reviewed. The risks                            and benefits of the procedure and the sedation                            options and risks were discussed with the patient.                            All questions were answered, and informed consent                            was obtained. Prior Anticoagulants: The patient has                            taken no anticoagulant or antiplatelet agents. ASA                            Grade Assessment: II - A patient with mild systemic                            disease. After reviewing the risks and benefits,                            the patient was deemed in satisfactory condition to                            undergo the procedure.  After obtaining informed consent, the colonoscope                            was passed under direct vision. Throughout the                            procedure, the patient's blood pressure, pulse, and                            oxygen saturations were monitored continuously. The                            Olympus [id number] ([id number]) Colonoscope was                            introduced through the anus and  advanced to the the                            terminal ileum. The colonoscopy was performed                            without difficulty. The patient tolerated the                            procedure well. The quality of the bowel                            preparation was good. The terminal ileum, ileocecal                            valve, appendiceal orifice, and rectum were                            photographed. Scope In: 1:55:00 PM Scope Out: 2:12:28 PM Scope Withdrawal Time: 0 hours 13 minutes 5 seconds  Total Procedure Duration: 0 hours 17 minutes 28 seconds  Findings:                 The terminal ileum appeared normal.                           Five sessile polyps were found in the sigmoid                            colon, transverse colon and ascending colon. The                            polyps were 3 to 6 mm in size. These polyps were                            removed with a cold snare. Resection and retrieval                            were complete.  Non-bleeding internal hemorrhoids were found during                            retroflexion. Complications:            No immediate complications. Estimated Blood Loss:     Estimated blood loss was minimal. Impression:               - The examined portion of the ileum was normal.                           - Five 3 to 6 mm polyps in the sigmoid colon, in                            the transverse colon and in the ascending colon,                            removed with a cold snare. Resected and retrieved.                           - Non-bleeding internal hemorrhoids. Recommendation:           - Discharge patient to home (with escort).                           - Await pathology results.                           - The findings and recommendations were discussed                            with the patient. Dr Particia Lather [address] [place],  04/10/2023 2:22:21 PM

## 2023-04-10 NOTE — Patient Instructions (Signed)
Thank you for letting us take care of your healthcare needs today. Please see handouts given to you on Polyps, Hemorrhoids, Gastritis and Esophagitis.    YOU HAD AN ENDOSCOPIC PROCEDURE TODAY AT THE [facility]:   Refer to the procedure report that was given to you for any specific questions about what was found during the examination.  If the procedure report does not answer your questions, please call your gastroenterologist to clarify.  If you requested that your care partner not be given the details of your procedure findings, then the procedure report has been included in a sealed envelope for you to review at your convenience later.  YOU SHOULD EXPECT: Some feelings of bloating in the abdomen. Passage of more gas than usual.  Walking can help get rid of the air that was put into your GI tract during the procedure and reduce the bloating. If you had a lower endoscopy (such as a colonoscopy or flexible sigmoidoscopy) you may notice spotting of blood in your stool or on the toilet paper. If you underwent a bowel prep for your procedure, you may not have a normal bowel movement for a few days.  Please Note:  You might notice some irritation and congestion in your nose or some drainage.  This is from the oxygen used during your procedure.  There is no need for concern and it should clear up in a day or so.  SYMPTOMS TO REPORT IMMEDIATELY:  Following lower endoscopy (colonoscopy or flexible sigmoidoscopy):  Excessive amounts of blood in the stool  Significant tenderness or worsening of abdominal pains  Swelling of the abdomen that is new, acute  Fever of 100F or higher  Following upper endoscopy (EGD)  Vomiting of blood or coffee ground material  New chest pain or pain under the shoulder blades  Painful or persistently difficult swallowing  New shortness of breath  Fever of 100F or higher  Black, tarry-looking stools  For urgent or emergent issues, a gastroenterologist can  be reached at any hour by calling [phone]. Do not use MyChart messaging for urgent concerns.    DIET:  We do recommend a small meal at first, but then you may proceed to your regular diet.  Drink plenty of fluids but you should avoid alcoholic beverages for 24 hours.  ACTIVITY:  You should plan to take it easy for the rest of today and you should NOT DRIVE or use heavy machinery until tomorrow (because of the sedation medicines used during the test).    FOLLOW UP: Our staff will call the number listed on your records the next business day following your procedure.  We will call around 7:15- 8:00 am to check on you and address any questions or concerns that you may have regarding the information given to you following your procedure. If we do not reach you, we will leave a message.     If any biopsies were taken you will be contacted by phone or by letter within the next 1-3 weeks.  Please call us at [phone] if you have not heard about the biopsies in 3 weeks.    SIGNATURES/CONFIDENTIALITY: You and/or your care partner have signed paperwork which will be entered into your electronic medical record.  These signatures attest to the fact that that the information above on your After Visit Summary has been reviewed and is understood.  Full responsibility of the confidentiality of this discharge information lies with you and/or your care-partner.

## 2023-04-11 ENCOUNTER — Telehealth: Payer: Self-pay | Admitting: *Deleted

## 2023-04-11 NOTE — Telephone Encounter (Signed)
Post procedure follow up call placed, no answer and left VM.  

## 2023-04-16 DIAGNOSIS — F321 Major depressive disorder, single episode, moderate: Secondary | ICD-10-CM | POA: Diagnosis not present

## 2023-04-23 DIAGNOSIS — F321 Major depressive disorder, single episode, moderate: Secondary | ICD-10-CM | POA: Diagnosis not present

## 2023-04-30 ENCOUNTER — Encounter: Payer: Self-pay | Admitting: Internal Medicine

## 2023-04-30 DIAGNOSIS — F321 Major depressive disorder, single episode, moderate: Secondary | ICD-10-CM | POA: Diagnosis not present

## 2023-05-07 DIAGNOSIS — F321 Major depressive disorder, single episode, moderate: Secondary | ICD-10-CM | POA: Diagnosis not present

## 2023-05-14 DIAGNOSIS — F321 Major depressive disorder, single episode, moderate: Secondary | ICD-10-CM | POA: Diagnosis not present

## 2023-05-21 DIAGNOSIS — F321 Major depressive disorder, single episode, moderate: Secondary | ICD-10-CM | POA: Diagnosis not present

## 2023-05-28 DIAGNOSIS — F321 Major depressive disorder, single episode, moderate: Secondary | ICD-10-CM | POA: Diagnosis not present

## 2023-05-29 ENCOUNTER — Other Ambulatory Visit: Payer: Self-pay | Admitting: Internal Medicine

## 2023-06-03 DIAGNOSIS — R5383 Other fatigue: Secondary | ICD-10-CM | POA: Diagnosis not present

## 2023-06-03 DIAGNOSIS — D6489 Other specified anemias: Secondary | ICD-10-CM | POA: Diagnosis not present

## 2023-06-03 DIAGNOSIS — R799 Abnormal finding of blood chemistry, unspecified: Secondary | ICD-10-CM | POA: Diagnosis not present

## 2023-06-03 DIAGNOSIS — R947 Abnormal results of other endocrine function studies: Secondary | ICD-10-CM | POA: Diagnosis not present

## 2023-06-03 DIAGNOSIS — E782 Mixed hyperlipidemia: Secondary | ICD-10-CM | POA: Diagnosis not present

## 2023-06-03 DIAGNOSIS — E2749 Other adrenocortical insufficiency: Secondary | ICD-10-CM | POA: Diagnosis not present

## 2023-06-03 DIAGNOSIS — E559 Vitamin D deficiency, unspecified: Secondary | ICD-10-CM | POA: Diagnosis not present

## 2023-06-03 DIAGNOSIS — Z79890 Hormone replacement therapy (postmenopausal): Secondary | ICD-10-CM | POA: Diagnosis not present

## 2023-06-03 DIAGNOSIS — B2790 Infectious mononucleosis, unspecified without complication: Secondary | ICD-10-CM | POA: Diagnosis not present

## 2023-06-03 DIAGNOSIS — Z7689 Persons encountering health services in other specified circumstances: Secondary | ICD-10-CM | POA: Diagnosis not present

## 2023-06-03 LAB — BASIC METABOLIC PANEL
BUN: 13 (ref 4–21)
CO2: 26 — AB (ref 13–22)
Chloride: 102 (ref 99–108)
Creatinine: 0.6 (ref 0.5–1.1)
Glucose: 77
Potassium: 4.7 mEq/L (ref 3.5–5.1)
Sodium: 140 (ref 137–147)

## 2023-06-03 LAB — COMPREHENSIVE METABOLIC PANEL
Albumin: 4.2 (ref 3.5–5.0)
Calcium: 9.3 (ref 8.7–10.7)
eGFR: 111

## 2023-06-03 LAB — HEPATIC FUNCTION PANEL
ALT: 45 U/L — AB (ref 7–35)
AST: 27 (ref 13–35)
Alkaline Phosphatase: 123 (ref 25–125)

## 2023-06-03 LAB — VITAMIN D 25 HYDROXY (VIT D DEFICIENCY, FRACTURES): Vit D, 25-Hydroxy: 44.4

## 2023-06-03 LAB — CBC AND DIFFERENTIAL
HCT: 39 (ref 36–46)
Hemoglobin: 13 (ref 12.0–16.0)
Platelets: 273 10*3/uL (ref 150–400)
WBC: 7.5

## 2023-06-04 ENCOUNTER — Other Ambulatory Visit: Payer: Self-pay | Admitting: Internal Medicine

## 2023-06-04 DIAGNOSIS — F321 Major depressive disorder, single episode, moderate: Secondary | ICD-10-CM | POA: Diagnosis not present

## 2023-06-05 ENCOUNTER — Telehealth: Payer: Self-pay | Admitting: Pharmacy Technician

## 2023-06-05 ENCOUNTER — Other Ambulatory Visit (HOSPITAL_COMMUNITY): Payer: Self-pay

## 2023-06-05 NOTE — Telephone Encounter (Signed)
Patient Advocate Encounter  Received notification from [employer] that prior authorization for LANSOPRAZOLE 30MG  is required.   PA submitted on 6.20.24 Key [id number] Status is pending

## 2023-06-05 NOTE — Telephone Encounter (Signed)
PA has been submitted EXPEDITED, and telephone encounter has been created.  

## 2023-06-06 ENCOUNTER — Other Ambulatory Visit (HOSPITAL_COMMUNITY): Payer: Self-pay

## 2023-06-06 NOTE — Telephone Encounter (Signed)
Patient Advocate Encounter  Prior Authorization for LANSOPRAZOLE 30MG  has been approved with [employer].    PA# [id number] Effective dates: 6.20.24 through 6.20.25  Per [facility] test claim, copay for 30 days supply is $5.04

## 2023-06-09 ENCOUNTER — Encounter: Payer: Self-pay | Admitting: Nurse Practitioner

## 2023-06-11 DIAGNOSIS — F321 Major depressive disorder, single episode, moderate: Secondary | ICD-10-CM | POA: Diagnosis not present

## 2023-06-17 DIAGNOSIS — F330 Major depressive disorder, recurrent, mild: Secondary | ICD-10-CM | POA: Diagnosis not present

## 2023-06-17 DIAGNOSIS — F411 Generalized anxiety disorder: Secondary | ICD-10-CM | POA: Diagnosis not present

## 2023-06-18 DIAGNOSIS — F321 Major depressive disorder, single episode, moderate: Secondary | ICD-10-CM | POA: Diagnosis not present

## 2023-06-25 DIAGNOSIS — F321 Major depressive disorder, single episode, moderate: Secondary | ICD-10-CM | POA: Diagnosis not present

## 2023-07-03 DIAGNOSIS — Z6841 Body Mass Index (BMI) 40.0 and over, adult: Secondary | ICD-10-CM | POA: Diagnosis not present

## 2023-07-03 DIAGNOSIS — R319 Hematuria, unspecified: Secondary | ICD-10-CM | POA: Diagnosis not present

## 2023-07-03 DIAGNOSIS — Z01419 Encounter for gynecological examination (general) (routine) without abnormal findings: Secondary | ICD-10-CM | POA: Diagnosis not present

## 2023-07-04 ENCOUNTER — Ambulatory Visit
Admission: EM | Admit: 2023-07-04 | Discharge: 2023-07-04 | Disposition: A | Discharge status: Home / Self Care | Payer: 59 | Attending: Urgent Care | Admitting: Urgent Care

## 2023-07-04 DIAGNOSIS — R051 Acute cough: Secondary | ICD-10-CM

## 2023-07-04 DIAGNOSIS — R430 Anosmia: Secondary | ICD-10-CM | POA: Diagnosis not present

## 2023-07-04 DIAGNOSIS — R52 Pain, unspecified: Secondary | ICD-10-CM | POA: Diagnosis not present

## 2023-07-04 DIAGNOSIS — Z1152 Encounter for screening for COVID-19: Secondary | ICD-10-CM | POA: Diagnosis not present

## 2023-07-04 DIAGNOSIS — J4521 Mild intermittent asthma with (acute) exacerbation: Secondary | ICD-10-CM | POA: Diagnosis not present

## 2023-07-04 DIAGNOSIS — R0602 Shortness of breath: Secondary | ICD-10-CM | POA: Diagnosis not present

## 2023-07-04 MED ORDER — ALBUTEROL SULFATE (2.5 MG/3ML) 0.083% IN NEBU: 2.5000 mg | INHALATION_SOLUTION | Freq: Four times a day (QID) | RESPIRATORY_TRACT | 0 refills | Status: AC | PRN

## 2023-07-04 MED ORDER — MONTELUKAST SODIUM 10 MG PO TABS: 10.0000 mg | ORAL_TABLET | Freq: Every day | ORAL | 0 refills | Status: DC

## 2023-07-04 MED ORDER — ALBUTEROL SULFATE (2.5 MG/3ML) 0.083% IN NEBU
2.5000 mg | INHALATION_SOLUTION | Freq: Once | RESPIRATORY_TRACT | Status: AC
  Administered 2023-07-04: 2.5 mg via RESPIRATORY_TRACT

## 2023-07-04 MED ORDER — DEXAMETHASONE 6 MG PO TABS: 6.0000 mg | ORAL_TABLET | Freq: Every day | ORAL | 0 refills | Status: DC

## 2023-07-04 MED ORDER — GUAIFENESIN ER 600 MG PO TB12: 600.0000 mg | ORAL_TABLET | Freq: Two times a day (BID) | ORAL | 0 refills | Status: DC | PRN

## 2023-07-04 NOTE — Discharge Instructions (Addendum)
Your covid test is pending, we will call with the results when available.  Please take one tablet of dexamethasone daily until completed. Best taken with breakfast in the morning.  Take one tablet of montelukast every night before bed. This may make you sleepy. You can stop the medication early if symptoms are resolved.  If needed, please purchase a nebulizer machine. You will use the nebulizer albuterol solution every 6 hours as needed in place of your handheld inhaler. Do not use both.  Use mucinex with plenty of water to break up your phlegm -spit it out.  If you develop severe shortness of breath, chest pain or high fever, return for recheck or head to the ER.

## 2023-07-04 NOTE — ED Provider Notes (Signed)
[facility]    CSN: [id number] Arrival date & time: 07/04/23  1131      History   Chief Complaint Chief Complaint  Patient presents with   Cough   Shortness of Breath   Generalized Body Aches    HPI Maria Clark is a 52 y.o. female.   Pleasant 52yo female with known hx of asthma and OSA presents today on the third day of shortness of breath and cough. States sx started on Wednesday primarily with cough and wheezing. Pt also endorses headache, hot flashes/ chills, myalgias and decreased taste/smell. Took a home covid test the first day of symptoms, was negative. Pt states to date she has never tested positive for covid. Takes Symbicort daily for her asthma, has used her albuterol inhaler pump rather persistently for the past three days with minimal relief to her wheezing. Had to sleep upright last evening. Denies known fever, CP or palpitations.   Cough Associated symptoms: shortness of breath   Shortness of Breath Associated symptoms: cough     Past Medical History:  Diagnosis Date   Anemia    Anxiety    Asthma    Complication of anesthesia    woozy out of it for more than a day previously   Depression    Endometriosis    Epstein Barr infection    Mental disorder    Depression   Sleep apnea     Patient Active Problem List   Diagnosis Date Noted   Dysphagia 01/07/2023   Post-nasal drip 12/03/2022   Elevated LFTs 10/01/2022   Amenorrhea 09/12/2022   Neck pain 09/04/2022   Obesity (BMI 30-39.9) 09/04/2022   MDD (major depressive disorder), recurrent severe, 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   Chronic diarrhea 04/09/2022   Pain of left thumb 03/05/2022   Insomnia 03/05/2022   Epstein Barr infection 12/25/2021   Anemia 12/12/2021   Angioma 12/12/2021   Weight gain 10/24/2021   Hyperglycemia 10/24/2021   Hyperlipidemia 06/26/2021   Chronic fatigue 06/26/2021   Chronic headaches 09/11/2020   Restless legs 09/11/2020   Asthma 09/11/2020    Essential hypertension 09/11/2020   OSA treated with BiPAP 09/11/2020   Endometriosis 06/28/2020   Nephrolithiasis 06/28/2020   Irritable bowel syndrome 07/02/2004    Past Surgical History:  Procedure Laterality Date   ANTERIOR CRUCIATE LIGAMENT REPAIR     right x 2, left x 1   CHOLECYSTECTOMY     LAPAROSCOPY     to determine endometriosis    OB History     Gravida  0   Para  0   Term  0   Preterm  0   AB  0   Living  0      SAB  0   IAB  0   Ectopic  0   Multiple  0   Live Births  0            Home Medications    Prior to Admission medications   Medication Sig Start Date End Date Taking? Authorizing Provider  albuterol (PROVENTIL) (2.5 MG/3ML) 0.083% nebulizer solution Take 3 mLs (2.5 mg total) by nebulization every 6 (six) hours as needed for wheezing or shortness of breath. 07/04/23  Yes Arlette Schaad L, PA  dexamethasone (DECADRON) 6 MG tablet Take 1 tablet (6 mg total) by mouth daily. 07/04/23  Yes Jadia Capers L, PA  guaiFENesin (MUCINEX) 600 MG 12 hr tablet Take 1 tablet (600 mg total) by mouth 2 (two) times  daily as needed for cough or to loosen phlegm. 07/04/23  Yes Nataliyah Packham L, PA  montelukast (SINGULAIR) 10 MG tablet Take 1 tablet (10 mg total) by mouth at bedtime. 07/04/23  Yes Donn Wilmot L, PA  albuterol (VENTOLIN HFA) 108 (90 Base) MCG/ACT inhaler Inhale 2 puffs into the lungs every 6 (six) hours as needed for wheezing or shortness of breath. 12/12/21   Tower, Audrie Gallus, MD  ALPRAZolam Prudy Feeler) 0.5 MG tablet Take 0.5 mg by mouth daily as needed. 11/12/22   [provider]  b complex vitamins capsule Take 1 capsule by mouth daily.    [provider]  buPROPion (WELLBUTRIN XL) 150 MG 24 hr tablet TAKE 2 TABLETS (300 MG TOTAL) BY MOUTH DAILY. 08/06/21   Reva Bores, MD  Carboxymethylcellul-Glycerin (LUBRICATING EYE DROPS OP) Place 1 drop into both eyes daily as needed (dry eyes).    [provider]   cariprazine (VRAYLAR) 1.5 MG capsule Take 1.5 mg by mouth daily.    [provider]  colesevelam (WELCHOL) 625 MG tablet Take 1,250 mg by mouth 2 (two) times daily with a meal.    [provider]  cyclobenzaprine (FLEXERIL) 10 MG tablet Take 1 tablet (10 mg total) by mouth 3 (three) times daily as needed for muscle spasms. 09/04/22   McElwee, Lauren A, NP  escitalopram (LEXAPRO) 20 MG tablet Take 1 tablet by mouth daily. 08/02/22   [provider]  fluticasone-salmeterol (ADVAIR) 100-50 MCG/ACT AEPB Inhale 1 puff into the lungs 2 (two) times daily. 01/21/23   McElwee, Lauren A, NP  hydrOXYzine (ATARAX) 50 MG tablet Take 50-100 mg by mouth at bedtime. 03/14/23   [provider]  ibuprofen (ADVIL) 200 MG tablet Take 400 mg by mouth every 6 (six) hours as needed for headache or moderate pain.    [provider]  ipratropium (ATROVENT) 0.03 % nasal spray Place 2 sprays into both nostrils every 12 (twelve) hours. 12/03/22   McElwee, Lauren A, NP  lansoprazole (PREVACID) 30 MG capsule Take 1 capsule (30 mg total) by mouth daily at 12 noon. 05/29/23   Imogene Burn, MD  rOPINIRole (REQUIP) 1 MG tablet Take 1 mg by mouth at bedtime. 04/02/22   [provider]  Solriamfetol HCl (SUNOSI) 150 MG TABS Take 150 mg by mouth daily.    [provider]    Family History Family History  Problem Relation Age of Onset   Hypertension Mother    Arthritis Mother    Benign prostatic hyperplasia Father    Aneurysm Paternal Aunt        cardiac - non-smoker   Aneurysm Paternal Grandfather        stomach - smoker   Polycystic ovary syndrome Neg Hx    Colon cancer Neg Hx    Esophageal cancer Neg Hx    Rectal cancer Neg Hx    Stomach cancer Neg Hx     Social History Social History   Tobacco Use   Smoking status: Never    Passive exposure: Never   Smokeless tobacco: Never  Vaping Use   Vaping status: Never Used  Substance Use Topics   Alcohol use:  Yes    Comment: rarely   Drug use: Never     Allergies   Dust mite extract   Review of Systems Review of Systems  Respiratory:  Positive for cough and shortness of breath.   As per HPI   Physical Exam Triage Vital Signs ED Triage  Vitals  Encounter Vitals Group     BP 07/04/23 1159 (!) 151/91     Systolic BP Percentile --      Diastolic BP Percentile --      Pulse Rate 07/04/23 1159 81     Resp 07/04/23 1159 20     Temp 07/04/23 1159 98.3 F (36.8 C)     Temp Source 07/04/23 1159 Oral     SpO2 07/04/23 1159 94 %     Weight --      Height --      Head Circumference --      Peak Flow --      Pain Score 07/04/23 1200 6     Pain Loc --      Pain Education --      Exclude from Growth Chart --    No data found.  Updated Vital Signs BP (!) 151/91 (BP Location: Right Arm)   Pulse 81   Temp 98.3 F (36.8 C) (Oral)   Resp 20   SpO2 94%   Visual Acuity Right Eye Distance:   Left Eye Distance:   Bilateral Distance:    Right Eye Near:   Left Eye Near:    Bilateral Near:     Physical Exam Vitals and nursing note reviewed.  Constitutional:      General: She is not in acute distress.    Appearance: She is well-developed. She is not ill-appearing or toxic-appearing.  HENT:     Head: Normocephalic and atraumatic.     Right Ear: Tympanic membrane, ear canal and external ear normal. There is no impacted cerumen.     Left Ear: Tympanic membrane, ear canal and external ear normal. There is no impacted cerumen.     Nose: Nose normal. No congestion or rhinorrhea.     Mouth/Throat:     Mouth: Mucous membranes are moist.     Pharynx: Oropharynx is clear. No oropharyngeal exudate or posterior oropharyngeal erythema.  Eyes:     General: No scleral icterus.       Right eye: No discharge.        Left eye: No discharge.     Extraocular Movements: Extraocular movements intact.     Conjunctiva/sclera: Conjunctivae normal.     Pupils: Pupils are equal, round, and reactive to  light.  Cardiovascular:     Rate and Rhythm: Normal rate and regular rhythm.     Heart sounds: No murmur heard. Pulmonary:     Effort: Pulmonary effort is normal. No accessory muscle usage, respiratory distress or retractions.     Breath sounds: Normal air entry. No stridor, decreased air movement or transmitted upper airway sounds. Examination of the right-upper field reveals wheezing. Examination of the left-upper field reveals wheezing. Examination of the right-middle field reveals wheezing. Examination of the left-middle field reveals wheezing. Examination of the right-lower field reveals wheezing. Examination of the left-lower field reveals wheezing. Decreased breath sounds (significantly improved s/p neb tx in office) and wheezing present. No rhonchi or rales.  Abdominal:     Palpations: Abdomen is soft.     Tenderness: There is no abdominal tenderness.  Musculoskeletal:        General: No swelling.     Cervical back: Normal range of motion and neck supple. No rigidity or tenderness.  Lymphadenopathy:     Cervical: No cervical adenopathy.  Skin:    General: Skin is warm and dry.     Capillary Refill: Capillary refill takes less than 2 seconds.  Neurological:     Mental Status: She is alert.  Psychiatric:        Mood and Affect: Mood normal.      UC Treatments / Results  Labs (all labs ordered are listed, but only abnormal results are displayed) Labs Reviewed  SARS CORONAVIRUS 2 (TAT 6-24 HRS)    EKG   Radiology No results found.  Procedures Procedures (including critical care time)  Medications Ordered in UC Medications  albuterol (PROVENTIL) (2.5 MG/3ML) 0.083% nebulizer solution 2.5 mg (2.5 mg Nebulization Given 07/04/23 1221)    Initial Impression / Assessment and Plan / UC Course  I have reviewed the triage vital signs and the nursing notes.  Pertinent labs & imaging results that were available during my care of the patient were reviewed by me and considered  in my medical decision making (see chart for details).  Clinical Course as of 07/04/23 1549  Fri Jul 04, 2023  1235 Recheck O2 - hovering between 94-97% [WC]    Clinical Course User Index [WC] Verlon Setting, Johnjoseph Rolfe L, PA    Asthma with acute exacerbation - significant improvement to breath sounds and wheezing noted s/p neb tx in office. O2 up to 97% prior to DC. Will DC pt home on dexamethasone 6mg  daily x 5 days and montelukast nightly. Pt encouraged to purchase neb machine, use neb solution called in today in place of handheld inhaler. Acute cough - secondary to #1. Plain mucinex only, avoid OTC cough syrups Generalized body aches - covid test obtained, results pending. Anosmia - may try OTC flonase or saline. Covid test pending.   Final Clinical Impressions(s) / UC Diagnoses   Final diagnoses:  Mild intermittent asthma with acute exacerbation  Acute cough  Generalized body aches  Anosmia     Discharge Instructions      Your covid test is pending, we will call with the results when available.  Please take one tablet of dexamethasone daily until completed. Best taken with breakfast in the morning.  Take one tablet of montelukast every night before bed. This may make you sleepy. You can stop the medication early if symptoms are resolved.  If needed, please purchase a nebulizer machine. You will use the nebulizer albuterol solution every 6 hours as needed in place of your handheld inhaler. Do not use both.  Use mucinex with plenty of water to break up your phlegm -spit it out.  If you develop severe shortness of breath, chest pain or high fever, return for recheck or head to the ER.     ED Prescriptions     Medication Sig Dispense Auth. Provider   dexamethasone (DECADRON) 6 MG tablet Take 1 tablet (6 mg total) by mouth daily. 5 tablet Ronnette Rump L, PA   albuterol (PROVENTIL) (2.5 MG/3ML) 0.083% nebulizer solution Take 3 mLs (2.5 mg total) by nebulization every 6 (six)  hours as needed for wheezing or shortness of breath. 75 mL Eamon Tantillo L, PA   guaiFENesin (MUCINEX) 600 MG 12 hr tablet Take 1 tablet (600 mg total) by mouth 2 (two) times daily as needed for cough or to loosen phlegm. 20 tablet Brion Hedges L, PA   montelukast (SINGULAIR) 10 MG tablet Take 1 tablet (10 mg total) by mouth at bedtime. 14 tablet Anastaisa Wooding L, [place]      PDMP not reviewed this encounter.   Maretta Bees, [place] 07/04/23 1550

## 2023-07-04 NOTE — ED Triage Notes (Signed)
Pt c/o SOB/wheezing, dry/ minimal prod cough, body aches day 3-last used albuterol inhaler yesterday/last tylenol yesterday-NAD-steady gait

## 2023-07-05 LAB — SARS CORONAVIRUS 2 (TAT 6-24 HRS): SARS Coronavirus 2: NEGATIVE

## 2023-07-08 ENCOUNTER — Encounter: Payer: Self-pay | Admitting: Nurse Practitioner

## 2023-07-08 ENCOUNTER — Ambulatory Visit: Payer: 59 | Admitting: Nurse Practitioner

## 2023-07-08 VITALS — BP 132/88 | HR 68 | Temp 98.5°F | Ht 66.0 in | Wt 269.6 lb

## 2023-07-08 DIAGNOSIS — J4541 Moderate persistent asthma with (acute) exacerbation: Secondary | ICD-10-CM | POA: Diagnosis not present

## 2023-07-08 DIAGNOSIS — R7989 Other specified abnormal findings of blood chemistry: Secondary | ICD-10-CM

## 2023-07-08 MED ORDER — BENZONATATE 100 MG PO CAPS: 100.0000 mg | ORAL_CAPSULE | Freq: Three times a day (TID) | ORAL | 0 refills | Status: DC | PRN

## 2023-07-08 NOTE — Progress Notes (Signed)
   Established Patient Office Visit  Subjective   Patient ID: Maria Clark, female    DOB: 1971/11/26  Age: 52 y.o. MRN: [id number]  Chief Complaint  Patient presents with   Asthma    Follow up, coughing, congestion, request a liver ultrasound    HPI  Maria Clark is here to follow-up on asthma.   She started with increased wheezing and cough. She went to urgent care and was treated with 5 days of dexamethasone and mucinex. This started last Wednesday. She denies fevers, although she was having hot and cold flashes over the weakness. She also had sore throat, but this has gotten better. Now she is having an ongoing cough and wheezing. She has been taking symbicort twice a day as scheduled.     ROS See pertinent positives and negatives per HPI.    Objective:     BP 132/88 (BP Location: Right Wrist)   Pulse 68   Temp 98.5 F (36.9 C)   Ht 5\' 6"  (1.676 m)   Wt 269 lb 9.6 oz (122.3 kg)   LMP 07/03/2023   SpO2 97%   BMI 43.51 kg/m    Physical Exam Vitals and nursing note reviewed.  Constitutional:      General: She is not in acute distress.    Appearance: Normal appearance.  HENT:     Head: Normocephalic.     Right Ear: Tympanic membrane, ear canal and external ear normal.     Left Ear: Tympanic membrane, ear canal and external ear normal.  Eyes:     Conjunctiva/sclera: Conjunctivae normal.  Cardiovascular:     Rate and Rhythm: Normal rate and regular rhythm.     Pulses: Normal pulses.     Heart sounds: Normal heart sounds.  Pulmonary:     Effort: Pulmonary effort is normal.     Breath sounds: Wheezing present.  Musculoskeletal:     Cervical back: Normal range of motion and neck supple. No tenderness.  Lymphadenopathy:     Cervical: No cervical adenopathy.  Skin:    General: Skin is warm.  Neurological:     General: No focal deficit present.     Mental Status: She is alert and oriented to person, place, and time.  Psychiatric:         Mood and Affect: Mood normal.        Behavior: Behavior normal.        Thought Content: Thought content normal.        Judgment: Judgment normal.    The 10-year ASCVD risk score (Arnett DK, et al., 2019) is: 1.4%    Assessment & Plan:   Problem List Items Addressed This Visit       Other   Elevated LFTs    Prior ultrasound showed infiltration in liver, possible fat. Recommend repeat US in 6 months, which is due. Order placed today.       Relevant Orders   US Abdomen Limited RUQ (LIVER/GB)   Other Visit Diagnoses     Moderate persistent asthma with exacerbation    -  Primary   Exacerbation d/t recent URI. Continue symbicort BID. Start albuterol inhaler or neb q6hr for 24 hr then prn. Tessalon TID prn cough. Continue mucinex/fluids.   Relevant Medications   budesonide-formoterol (SYMBICORT) 80-4.5 MCG/ACT inhaler       Return in about 6 months (around 01/08/2024) for CPE.    Gerre Scull, NP

## 2023-07-08 NOTE — Patient Instructions (Signed)
It was great to see you!  I have ordered an ultrasound of your liver, they will call to schedule.   Start tessalon 3 times a day as needed for cough. Keep doing the albuterol nebulizer or inhaler every 6 hours for the next day, then as needed again.   Let's follow-up in 6 months, sooner if you have concerns.  If a referral was placed today, you will be contacted for an appointment. Please note that routine referrals can sometimes take up to 3-4 weeks to process. Please call our office if you haven't heard anything after this time frame.  Take care,  Rodman Pickle, NP

## 2023-07-08 NOTE — Assessment & Plan Note (Signed)
Prior ultrasound showed infiltration in liver, possible fat. Recommend repeat US in 6 months, which is due. Order placed today.

## 2023-07-09 DIAGNOSIS — F321 Major depressive disorder, single episode, moderate: Secondary | ICD-10-CM | POA: Diagnosis not present

## 2023-07-16 DIAGNOSIS — F321 Major depressive disorder, single episode, moderate: Secondary | ICD-10-CM | POA: Diagnosis not present

## 2023-07-17 IMAGING — US US PELVIS COMPLETE WITH TRANSVAGINAL
1 series · 15 of 25 positions shown · non-contrast
Comparison: None

CLINICAL DATA: Elevated testosterone level. LMP February 2021, date
not provided



[Series 1: us pelvis complete with transvaginal · 15 of 90 slices shown]
[im 1/90]
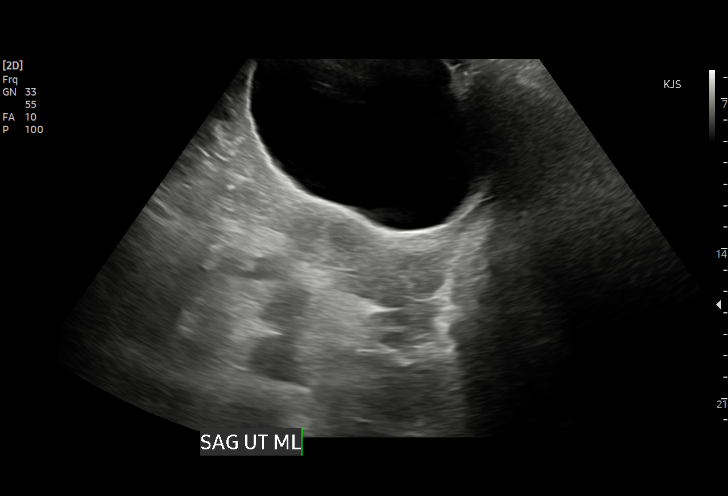
[im 8/90]
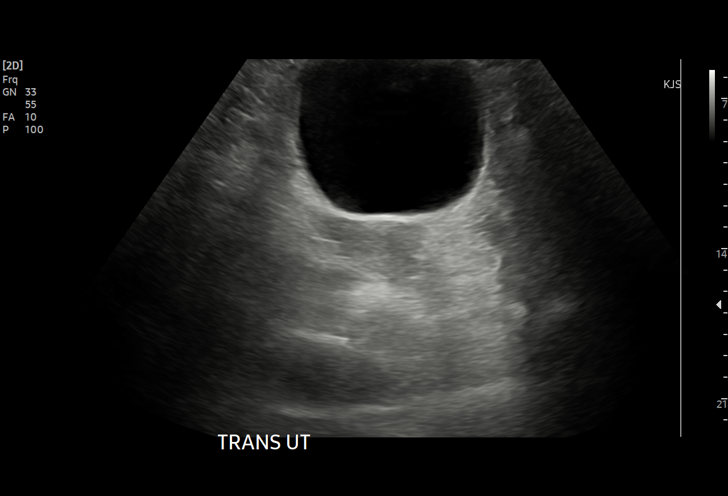
[im 15/90]
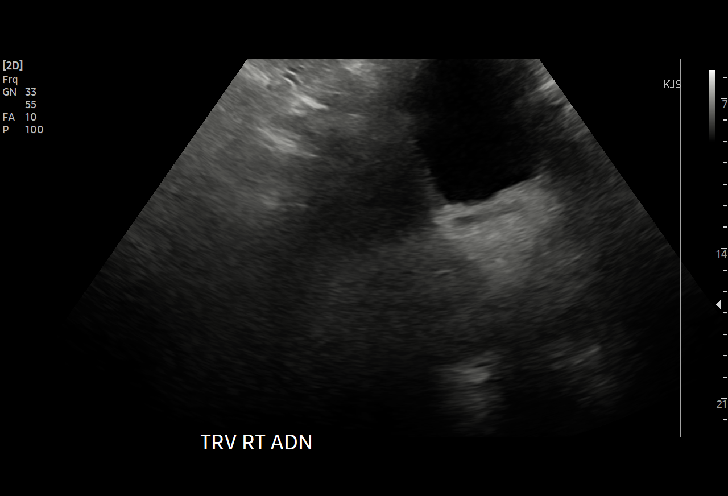
[im 19/90]
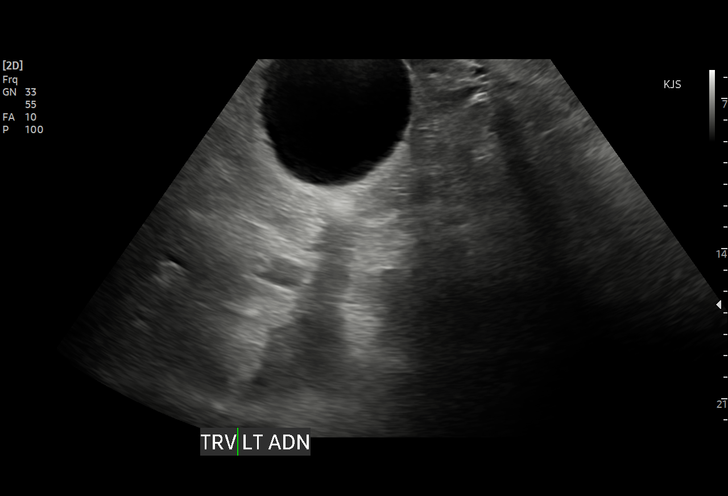
[im 26/90]
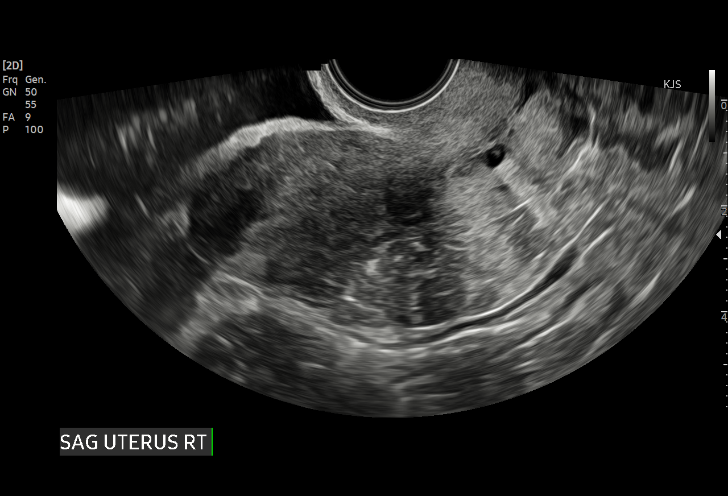
[im 34/90]
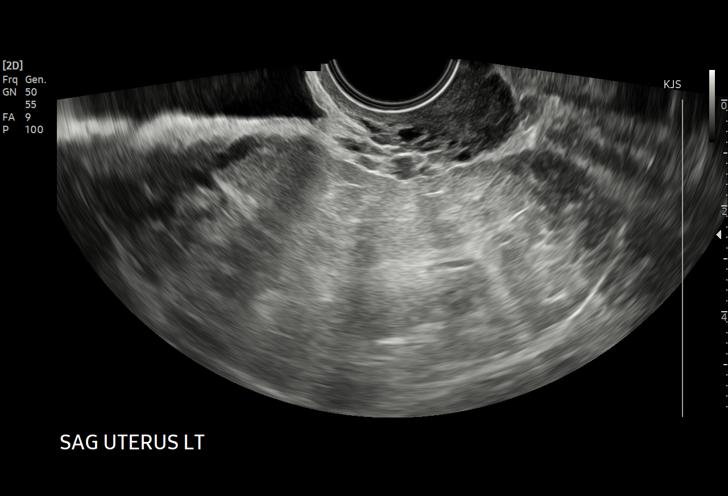
[im 38/90]
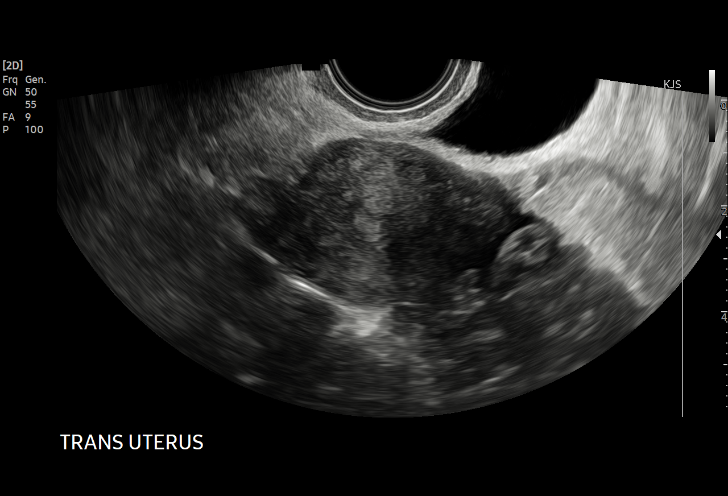
[im 45/90]
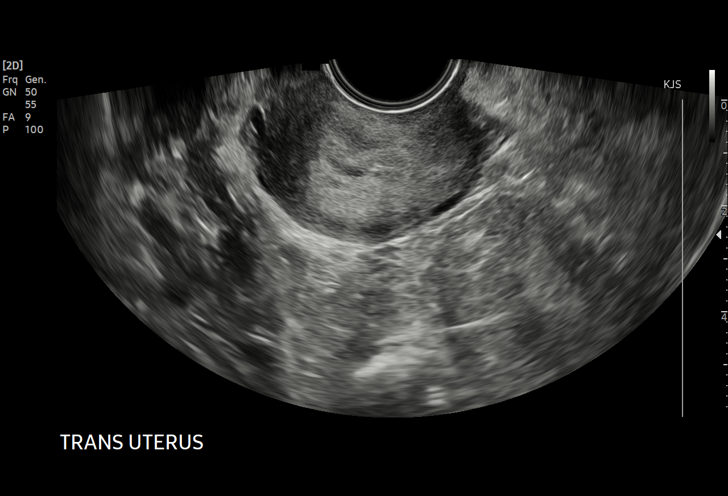
[im 52/90]
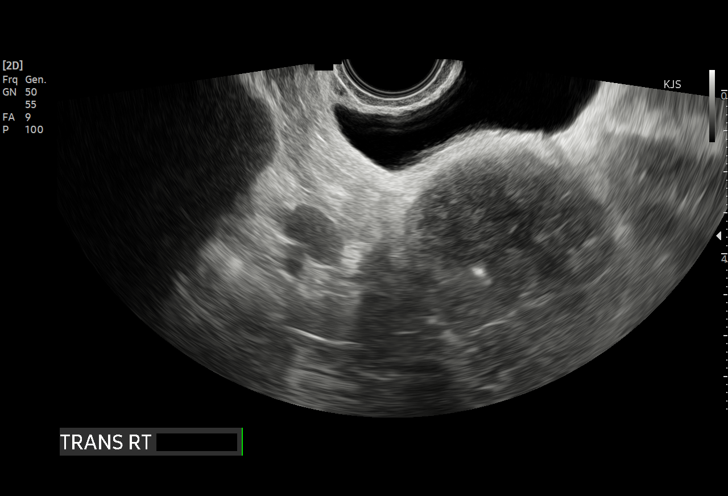
[im 56/90]
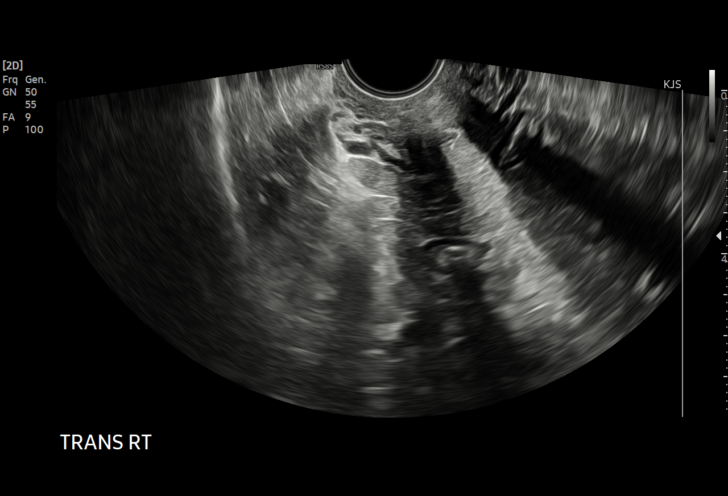
[im 64/90]
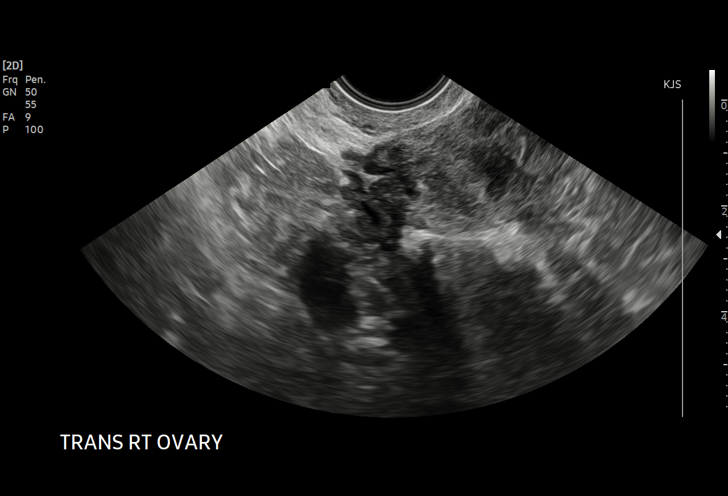
[im 71/90]
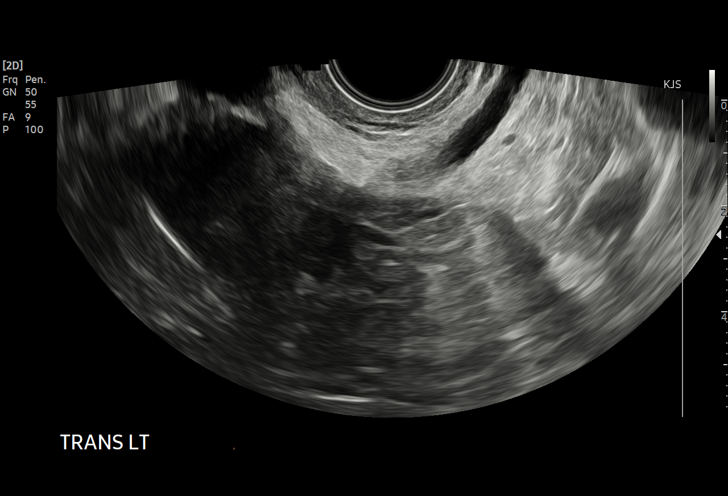
[im 75/90]
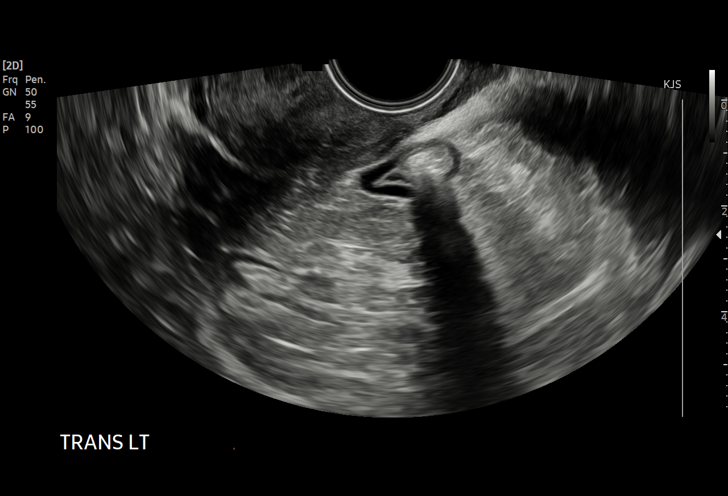
[im 82/90]
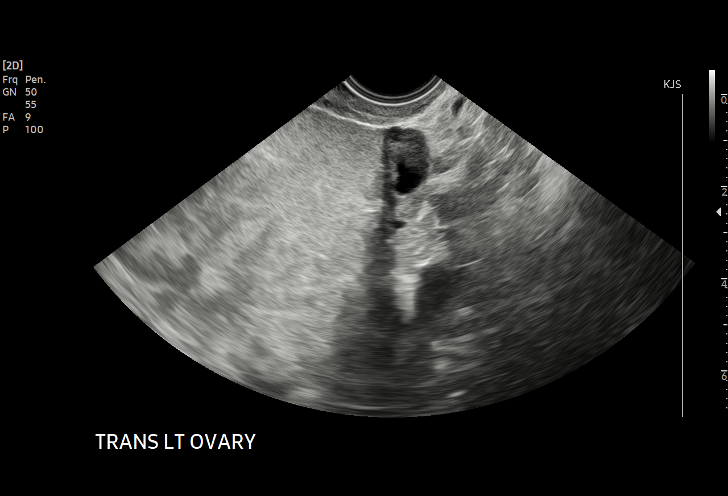
[im 90/90]
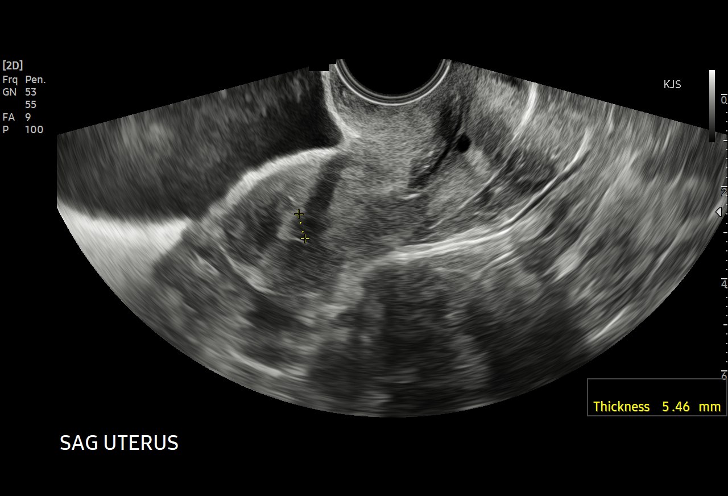

[15 of 25 positions shown; findings below may reference images not displayed]

FINDINGS: Uterus

Measurements: 7.6 x 2.9 x 4.2 cm = volume: 49 mL. Anteverted. Normal
morphology without mass

Endometrium

Thickness: 6 mm.  No endometrial fluid or focal abnormality

Right ovary

Measurements: 1.9 x 1.3 x 1.2 cm = volume: 1.5 mL. Normal morphology
without mass

Left ovary

Measurements: 2.9 x 2.0 x 1.2 cm = volume: 3.4 mL. Normal morphology
without mass

Other findings

No free pelvic fluid.  No adnexal masses.
IMPRESSION: No pelvic sonographic abnormalities identified.

## 2023-07-19 DIAGNOSIS — F325 Major depressive disorder, single episode, in full remission: Secondary | ICD-10-CM | POA: Diagnosis not present

## 2023-07-19 DIAGNOSIS — E785 Hyperlipidemia, unspecified: Secondary | ICD-10-CM | POA: Diagnosis not present

## 2023-07-19 DIAGNOSIS — J45909 Unspecified asthma, uncomplicated: Secondary | ICD-10-CM | POA: Diagnosis not present

## 2023-07-19 DIAGNOSIS — G4700 Insomnia, unspecified: Secondary | ICD-10-CM | POA: Diagnosis not present

## 2023-07-19 DIAGNOSIS — G47419 Narcolepsy without cataplexy: Secondary | ICD-10-CM | POA: Diagnosis not present

## 2023-07-19 DIAGNOSIS — D8481 Immunodeficiency due to conditions classified elsewhere: Secondary | ICD-10-CM | POA: Diagnosis not present

## 2023-07-19 DIAGNOSIS — F411 Generalized anxiety disorder: Secondary | ICD-10-CM | POA: Diagnosis not present

## 2023-07-19 DIAGNOSIS — G2581 Restless legs syndrome: Secondary | ICD-10-CM | POA: Diagnosis not present

## 2023-07-19 DIAGNOSIS — Z6841 Body Mass Index (BMI) 40.0 and over, adult: Secondary | ICD-10-CM | POA: Diagnosis not present

## 2023-07-19 DIAGNOSIS — G4733 Obstructive sleep apnea (adult) (pediatric): Secondary | ICD-10-CM | POA: Diagnosis not present

## 2023-07-21 ENCOUNTER — Ambulatory Visit: Payer: 59 | Admitting: Internal Medicine

## 2023-07-21 ENCOUNTER — Encounter: Payer: Self-pay | Admitting: Internal Medicine

## 2023-07-21 VITALS — BP 130/96 | HR 94 | Ht 66.0 in | Wt 267.5 lb

## 2023-07-21 DIAGNOSIS — R1310 Dysphagia, unspecified: Secondary | ICD-10-CM

## 2023-07-21 DIAGNOSIS — K209 Esophagitis, unspecified without bleeding: Secondary | ICD-10-CM

## 2023-07-21 DIAGNOSIS — K219 Gastro-esophageal reflux disease without esophagitis: Secondary | ICD-10-CM

## 2023-07-21 DIAGNOSIS — K2970 Gastritis, unspecified, without bleeding: Secondary | ICD-10-CM | POA: Diagnosis not present

## 2023-07-21 MED ORDER — LANSOPRAZOLE 30 MG PO CPDR
30.0000 mg | DELAYED_RELEASE_CAPSULE | Freq: Two times a day (BID) | ORAL | 2 refills | Status: DC
Start: 2023-07-21 — End: 2023-10-21

## 2023-07-21 NOTE — Progress Notes (Signed)
Chief Complaint: Dysphagia  HPI : 52 year old female with history of asthma, obesity, endometriosis, and depression presents for follow-up of dysphagia  Interval History: She was on pantoprazole 40 mg BID for 1 month but then had to stop it because insurance would not cover it. She did not have any choking episodes on the BID pantoprazole. Denies burning or regurgitation. Currently her dysphagia has returned.  Food will sometimes feel stuck and she does feel like she has to clear her throat at times.   Past Medical History:  Diagnosis Date   Anemia    Anxiety    Asthma    Complication of anesthesia    woozy out of it for more than a day previously   Depression    Endometriosis    Epstein Barr infection    Mental disorder    Depression   PCOS (polycystic ovarian syndrome)    Sleep apnea      Past Surgical History:  Procedure Laterality Date   ANTERIOR CRUCIATE LIGAMENT REPAIR     right x 2, left x 1   CHOLECYSTECTOMY     LAPAROSCOPY     to determine endometriosis   Family History  Problem Relation Age of Onset   Hypertension Mother    Arthritis Mother    Benign prostatic hyperplasia Father    Aneurysm Paternal Aunt        cardiac - non-smoker   Aneurysm Paternal Grandfather        stomach - smoker   Polycystic ovary syndrome Neg Hx    Colon cancer Neg Hx    Esophageal cancer Neg Hx    Rectal cancer Neg Hx    Stomach cancer Neg Hx    Social History   Tobacco Use   Smoking status: Never    Passive exposure: Never   Smokeless tobacco: Never  Vaping Use   Vaping status: Never Used  Substance Use Topics   Alcohol use: Yes    Comment: rarely   Drug use: Never   Current Outpatient Medications  Medication Sig Dispense Refill   albuterol (PROVENTIL) (2.5 MG/3ML) 0.083% nebulizer solution Take 3 mLs (2.5 mg total) by nebulization every 6 (six) hours as needed for wheezing or shortness of breath. 75 mL 0   albuterol (VENTOLIN HFA) 108 (90 Base) MCG/ACT inhaler  Inhale 2 puffs into the lungs every 6 (six) hours as needed for wheezing or shortness of breath. 1 each 1   ALPRAZolam (XANAX) 0.5 MG tablet Take 0.5 mg by mouth daily as needed.     b complex vitamins capsule Take 1 capsule by mouth daily.     budesonide-formoterol (SYMBICORT) 80-4.5 MCG/ACT inhaler Inhale 2 puffs into the lungs 2 (two) times daily.     buPROPion (WELLBUTRIN XL) 150 MG 24 hr tablet TAKE 2 TABLETS (300 MG TOTAL) BY MOUTH DAILY. 180 tablet 3   Carboxymethylcellul-Glycerin (LUBRICATING EYE DROPS OP) Place 1 drop into both eyes daily as needed (dry eyes).     cariprazine (VRAYLAR) 1.5 MG capsule Take 1.5 mg by mouth daily.     colesevelam (WELCHOL) 625 MG tablet Take 1,250 mg by mouth 2 (two) times daily with a meal.     cyclobenzaprine (FLEXERIL) 10 MG tablet Take 1 tablet (10 mg total) by mouth 3 (three) times daily as needed for muscle spasms. 30 tablet 2   escitalopram (LEXAPRO) 20 MG tablet Take 1 tablet by mouth daily.     hydrOXYzine (ATARAX) 50 MG tablet Take 50-100  mg by mouth at bedtime.     ibuprofen (ADVIL) 200 MG tablet Take 400 mg by mouth every 6 (six) hours as needed for headache or moderate pain.     ipratropium (ATROVENT) 0.03 % nasal spray Place 2 sprays into both nostrils every 12 (twelve) hours. 30 mL 12   montelukast (SINGULAIR) 10 MG tablet Take 1 tablet (10 mg total) by mouth at bedtime. 14 tablet 0   rOPINIRole (REQUIP) 1 MG tablet Take 1 mg by mouth at bedtime.     Solriamfetol HCl (SUNOSI) 150 MG TABS Take 150 mg by mouth daily.     No current facility-administered medications for this visit.   Allergies  Allergen Reactions   Dust Mite Extract Other (See Comments) and Swelling    Client reports "dust makes my asthma worse" it can increase wheezing..  Other Reaction(s): Bronchospasm   Physical Exam: BP (!) 130/96   Pulse 94   Ht 5\' 6"  (1.676 m)   Wt 267 lb 8 oz (121.3 kg)   LMP 07/03/2023   SpO2 96%   BMI 43.18 kg/m  Constitutional:  Pleasant,well-developed, female in no acute distress. HEENT: Normocephalic and atraumatic. Conjunctivae are normal. No scleral icterus. Cardiovascular: Normal rate, regular rhythm.  Pulmonary/chest: Effort normal and breath sounds normal. No wheezing, rales or rhonchi. Abdominal: Soft, nondistended, nontender. Bowel sounds active throughout. There are no masses palpable. No hepatomegaly. Extremities: No edema Neurological: Alert and oriented to person place and time. Skin: Skin is warm and dry. No rashes noted. Psychiatric: Normal mood and affect. Behavior is normal.  Labs 12/2022: CBC unremarkable. CMP with mildly elevated ALT of 40.   Colonoscopy 11/09/18: Indication is IDA. 2 polyps in the ascending colon removed with cold biopsy forceps. Sigmoid diverticulosis. Internal hemorrhoids. Good prep. Follow up in 5 years.  Path: TA and HP  EGD 04/10/23: - LA Grade B esophagitis with no bleeding. Biopsied.  - Gastritis. Biopsied.  - Normal examined duodenum. Path: 1. Surgical [P], gastric biopsies GASTRIC ANTRAL / OXYNTIC MUCOSA WITH REACTIVE CHANGES. NO H. PYLORI IDENTIFIED ON H&E. NEGATIVE FOR INTESTINAL METAPLASIA OR DYSPLASIA. 2. Surgical [P], esophageal biopsies ESOPHAGEAL SQUAMOUS MUCOSA WITH NO SIGNIFICANT DIAGNOSTIC ALTERATION. NO EVIDENCE OF INTRAEPITHELIAL EOSINOPHILS OR LYMPHOCYTES.  Colonoscopy 04/10/23:  Path: 3. Surgical [P], colon, transverse, ascending and sigmoid, polyp (5) FRAGMENTS OF TUBULAR ADENOMA. NEGATIVE FOR HIGH-GRADE DYSPLASIA.  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  Per review of her prior authorization for lansoprazole, her insurance will cover lansoprazole therapy.  This will get her restarted on PPI twice daily treatment for dysphagia  that is likely due to reflux esophagitis.  Her last EGD did showed grade B esophagitis.   - Previously gave GERD handout - Lansoprazole 30 mg BID. If this is effective, then will try every day after about a month - Next colonoscopy is due in 03/2026 for polyp surveillance - RTC 3 months   Eulah Pont, MD  I spent 24 minutes of time, including in depth chart review, independent review of results as outlined above, communicating results with the patient directly, face-to-face time with the patient, coordinating care, ordering studies and medications as appropriate, and documentation.

## 2023-07-21 NOTE — Patient Instructions (Addendum)
We have sent the following medications to your pharmacy for you to pick up at your convenience:Lansoprazole   You are schedule for a follow up visit on 10/21/23 at 1:30 pm  If your blood pressure at your visit was 140/90 or greater, please contact your primary care physician to follow up on this.  _______________________________________________________  If you are age 50 or older, your body mass index should be between 23-30. Your Body mass index is 43.18 kg/m. If this is out of the aforementioned range listed, please consider follow up with your Primary Care Provider.  If you are age 58 or younger, your body mass index should be between 19-25. Your Body mass index is 43.18 kg/m. If this is out of the aformentioned range listed, please consider follow up with your Primary Care Provider.   ________________________________________________________  The [facility] providers would like to encourage you to use [facility] to communicate with providers for non-urgent requests or questions.  Due to long hold times on the telephone, sending your provider a message by [facility] may be a faster and more efficient way to get a response.  Please allow 48 business hours for a response.  Please remember that this is for non-urgent requests.  _______________________________________________________   Thank you for entrusting me with your care and for choosing [facility], Dr. Eulah Pont

## 2023-07-22 ENCOUNTER — Ambulatory Visit
Admission: RE | Admit: 2023-07-22 | Discharge: 2023-07-22 | Disposition: A | Discharge status: Home / Self Care | Payer: 59 | Source: Ambulatory Visit | Attending: Nurse Practitioner | Admitting: Nurse Practitioner

## 2023-07-22 DIAGNOSIS — R7989 Other specified abnormal findings of blood chemistry: Secondary | ICD-10-CM

## 2023-07-22 DIAGNOSIS — K760 Fatty (change of) liver, not elsewhere classified: Secondary | ICD-10-CM | POA: Diagnosis not present

## 2023-07-23 ENCOUNTER — Encounter: Payer: Self-pay | Admitting: Nurse Practitioner

## 2023-07-23 DIAGNOSIS — F321 Major depressive disorder, single episode, moderate: Secondary | ICD-10-CM | POA: Diagnosis not present

## 2023-08-06 DIAGNOSIS — F321 Major depressive disorder, single episode, moderate: Secondary | ICD-10-CM | POA: Diagnosis not present

## 2023-08-12 DIAGNOSIS — J029 Acute pharyngitis, unspecified: Secondary | ICD-10-CM | POA: Diagnosis not present

## 2023-08-13 ENCOUNTER — Ambulatory Visit: Payer: 59 | Admitting: Nurse Practitioner

## 2023-08-13 VITALS — BP 124/82 | HR 78 | Temp 98.0°F | Wt 271.2 lb

## 2023-08-13 DIAGNOSIS — J029 Acute pharyngitis, unspecified: Secondary | ICD-10-CM

## 2023-08-13 DIAGNOSIS — J020 Streptococcal pharyngitis: Secondary | ICD-10-CM | POA: Diagnosis not present

## 2023-08-13 LAB — POCT INFLUENZA A/B
Influenza A, POC: NEGATIVE
Influenza B, POC: NEGATIVE

## 2023-08-13 LAB — POC COVID19 BINAXNOW: SARS Coronavirus 2 Ag: NEGATIVE

## 2023-08-13 LAB — POCT RAPID STREP A (OFFICE): Rapid Strep A Screen: POSITIVE — AB

## 2023-08-13 MED ORDER — FLUCONAZOLE 150 MG PO TABS: 150.0000 mg | ORAL_TABLET | Freq: Once | ORAL | 0 refills | Status: AC

## 2023-08-13 MED ORDER — AMOXICILLIN 500 MG PO CAPS: 500.0000 mg | ORAL_CAPSULE | Freq: Two times a day (BID) | ORAL | 0 refills | Status: AC

## 2023-08-13 NOTE — Progress Notes (Signed)
Acute Office Visit  Subjective:     Patient ID: Maria Clark, female    DOB: 1970/12/21, 52 y.o.   MRN: [id number]  Chief Complaint  Patient presents with   Sore Throat    Pain on left side of throat,congestion, fatigue-started Monday and went to Urgent Care on Tuesday    Sore Throat    Patient is in today for sore throat and fatigue that started Monday afternoon.  She states that this is the worst sore throat that she has had, specifically on the left side.  She feels like she is swallowing razor blades.  She denies fevers, cough, shortness of breath, chest pain.  She states that the pain will radiate to her left ear and feels like fullness, she also notes that she has started having a little bit of nasal congestion.  She went to urgent care earlier yesterday morning and had a negative rapid strep test and a negative COVID test at home.  She wanted to come here for second opinion. She has been taking ibuprofen which helps a little with the pain.   ROS See pertinent positives and negatives per HPI.     Objective:    BP 124/82 (BP Location: Right Wrist)   Pulse 78   Temp 98 F (36.7 C)   Wt 271 lb 3.2 oz (123 kg)   SpO2 98%   BMI 43.77 kg/m    Physical Exam Vitals and nursing note reviewed.  Constitutional:      General: She is not in acute distress.    Appearance: Normal appearance.  HENT:     Head: Normocephalic.     Right Ear: Tympanic membrane, ear canal and external ear normal.     Left Ear: Tympanic membrane, ear canal and external ear normal.     Mouth/Throat:     Pharynx: Posterior oropharyngeal erythema present.     Comments: Unable to see tonsils Eyes:     Conjunctiva/sclera: Conjunctivae normal.  Cardiovascular:     Rate and Rhythm: Normal rate and regular rhythm.     Pulses: Normal pulses.     Heart sounds: Normal heart sounds.  Pulmonary:     Effort: Pulmonary effort is normal.     Breath sounds: Normal breath sounds.  Musculoskeletal:      Cervical back: Normal range of motion and neck supple. Tenderness present.  Lymphadenopathy:     Cervical: No cervical adenopathy.  Skin:    General: Skin is warm.  Neurological:     General: No focal deficit present.     Mental Status: She is alert and oriented to person, place, and time.  Psychiatric:        Mood and Affect: Mood normal.        Behavior: Behavior normal.        Thought Content: Thought content normal.        Judgment: Judgment normal.     Results for orders placed or performed in visit on 08/13/23  POCT Influenza A/B  Result Value Ref Range   Influenza A, POC Negative Negative   Influenza B, POC Negative Negative  POCT rapid strep A  Result Value Ref Range   Rapid Strep A Screen Positive (A) Negative  POC COVID-19 BinaxNow  Result Value Ref Range   SARS Coronavirus 2 Ag Negative Negative        Assessment & Plan:   Problem List Items Addressed This Visit   None Visit Diagnoses     Strep  pharyngitis    -  Primary   Will treat with amoxicillin 500mg  BID x10 days. Diflucan 150mg  after to prevent yeast infection. F/U if not improving. Contagious x24 hours   Relevant Medications   fluconazole (DIFLUCAN) 150 MG tablet   Sore throat       POC strep +, covid and flu negative. See plan above. Can gargle warm salt water and take ibuprofen prn sore throat. F/U if not improving.   Relevant Orders   POCT Influenza A/B (Completed)   POCT rapid strep A (Completed)   POC COVID-19 BinaxNow (Completed)       Meds ordered this encounter  Medications   amoxicillin (AMOXIL) 500 MG capsule    Sig: Take 1 capsule (500 mg total) by mouth 2 (two) times daily for 10 days.    Dispense:  20 capsule    Refill:  0   fluconazole (DIFLUCAN) 150 MG tablet    Sig: Take 1 tablet (150 mg total) by mouth once for 1 dose. Take after finishing antibiotics    Dispense:  1 tablet    Refill:  0    Return if symptoms worsen or fail to improve.  Gerre Scull, NP

## 2023-08-13 NOTE — Patient Instructions (Addendum)
It was great to see you!  Start amoxicillin twice a day for 10 days. You can take the diflucan after finishing the antibiotics  You can gargle with warm salt water for the sore throat and take ibuprofen as needed.   Let's follow-up if your symptoms worsen or don't improve.   Take care,  Rodman Pickle, NP

## 2023-08-20 DIAGNOSIS — F321 Major depressive disorder, single episode, moderate: Secondary | ICD-10-CM | POA: Diagnosis not present

## 2023-08-22 ENCOUNTER — Encounter: Payer: Self-pay | Admitting: Nurse Practitioner

## 2023-08-22 ENCOUNTER — Other Ambulatory Visit: Payer: Self-pay | Admitting: Obstetrics & Gynecology

## 2023-08-22 DIAGNOSIS — Z0000 Encounter for general adult medical examination without abnormal findings: Secondary | ICD-10-CM

## 2023-08-27 DIAGNOSIS — F321 Major depressive disorder, single episode, moderate: Secondary | ICD-10-CM | POA: Diagnosis not present

## 2023-09-03 DIAGNOSIS — F321 Major depressive disorder, single episode, moderate: Secondary | ICD-10-CM | POA: Diagnosis not present

## 2023-09-05 ENCOUNTER — Ambulatory Visit
Admission: RE | Admit: 2023-09-05 | Discharge: 2023-09-05 | Disposition: A | Discharge status: Home / Self Care | Payer: 59 | Source: Ambulatory Visit | Attending: Obstetrics & Gynecology | Admitting: Obstetrics & Gynecology

## 2023-09-05 DIAGNOSIS — Z1231 Encounter for screening mammogram for malignant neoplasm of breast: Secondary | ICD-10-CM | POA: Diagnosis not present

## 2023-09-05 DIAGNOSIS — Z0000 Encounter for general adult medical examination without abnormal findings: Secondary | ICD-10-CM

## 2023-09-10 ENCOUNTER — Other Ambulatory Visit: Payer: Self-pay | Admitting: Obstetrics & Gynecology

## 2023-09-10 DIAGNOSIS — R928 Other abnormal and inconclusive findings on diagnostic imaging of breast: Secondary | ICD-10-CM

## 2023-09-10 DIAGNOSIS — F321 Major depressive disorder, single episode, moderate: Secondary | ICD-10-CM | POA: Diagnosis not present

## 2023-09-17 ENCOUNTER — Ambulatory Visit
Admission: RE | Admit: 2023-09-17 | Discharge: 2023-09-17 | Disposition: A | Discharge status: Home / Self Care | Payer: 59 | Source: Ambulatory Visit | Attending: Obstetrics & Gynecology | Admitting: Obstetrics & Gynecology

## 2023-09-17 DIAGNOSIS — N6001 Solitary cyst of right breast: Secondary | ICD-10-CM | POA: Diagnosis not present

## 2023-09-17 DIAGNOSIS — F321 Major depressive disorder, single episode, moderate: Secondary | ICD-10-CM | POA: Diagnosis not present

## 2023-09-17 DIAGNOSIS — R928 Other abnormal and inconclusive findings on diagnostic imaging of breast: Secondary | ICD-10-CM

## 2023-10-01 DIAGNOSIS — F321 Major depressive disorder, single episode, moderate: Secondary | ICD-10-CM | POA: Diagnosis not present

## 2023-10-08 DIAGNOSIS — F321 Major depressive disorder, single episode, moderate: Secondary | ICD-10-CM | POA: Diagnosis not present

## 2023-10-15 DIAGNOSIS — F321 Major depressive disorder, single episode, moderate: Secondary | ICD-10-CM | POA: Diagnosis not present

## 2023-10-21 ENCOUNTER — Ambulatory Visit: Payer: 59 | Admitting: Internal Medicine

## 2023-10-21 ENCOUNTER — Encounter: Payer: Self-pay | Admitting: Internal Medicine

## 2023-10-21 VITALS — BP 132/76 | HR 84 | Ht 66.0 in | Wt 280.0 lb

## 2023-10-21 DIAGNOSIS — K219 Gastro-esophageal reflux disease without esophagitis: Secondary | ICD-10-CM

## 2023-10-21 DIAGNOSIS — R1310 Dysphagia, unspecified: Secondary | ICD-10-CM

## 2023-10-21 DIAGNOSIS — K209 Esophagitis, unspecified without bleeding: Secondary | ICD-10-CM

## 2023-10-21 DIAGNOSIS — Z8719 Personal history of other diseases of the digestive system: Secondary | ICD-10-CM | POA: Diagnosis not present

## 2023-10-21 MED ORDER — LANSOPRAZOLE 30 MG PO CPDR: 30.0000 mg | DELAYED_RELEASE_CAPSULE | Freq: Two times a day (BID) | ORAL | 2 refills | Status: AC

## 2023-10-21 NOTE — Progress Notes (Signed)
Chief Complaint: Dysphagia  HPI : 52 year old female with history of asthma, obesity, endometriosis, and depression presents for follow-up of dysphagia  Previously pantoprazole 40 mg BID was not covered by her insurance but this did seem to work well for 1 month.  Interval History: Patient has been taking lansoprazole 30 mg twice daily about 30 minutes to an hour before eating.  However she is still experiencing dysphagia episodes.  She has had at least 2 episodes of dysphagia since I last saw her in 07/2023.  Dysphagia occurs in the bottom of her throat and is only to solids/pills.  She is also having some issues with hoarseness and is concerned that this may be related to uncontrolled acid reflux.  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  Denies chest burning or regurgitation.  Denies nausea or vomiting.   Past Medical History:  Diagnosis Date   Anemia    Anxiety    Asthma    Complication of anesthesia    woozy out of it for more than a day previously   Depression    Endometriosis    Epstein Barr infection    Mental disorder    Depression   PCOS (polycystic ovarian syndrome)    Sleep apnea      Past Surgical History:  Procedure Laterality Date   ANTERIOR CRUCIATE LIGAMENT REPAIR     right x 2, left x 1   CHOLECYSTECTOMY     LAPAROSCOPY     to determine endometriosis   Family History  Problem Relation Age of Onset   Hypertension Mother    Arthritis Mother    Benign prostatic hyperplasia Father    Aneurysm Paternal Aunt        cardiac - non-smoker   Aneurysm Paternal Grandfather        stomach - smoker   Polycystic ovary syndrome Neg Hx    Colon cancer Neg Hx    Esophageal cancer Neg Hx    Rectal cancer Neg Hx    Stomach cancer Neg Hx    Social History   Tobacco Use   Smoking status: Never    Passive exposure: Never   Smokeless tobacco: Never  Vaping Use   Vaping status: Never Used   Substance Use Topics   Alcohol use: Yes    Comment: rarely   Drug use: Never   Current Outpatient Medications  Medication Sig Dispense Refill   albuterol (PROVENTIL) (2.5 MG/3ML) 0.083% nebulizer solution Take 3 mLs (2.5 mg total) by nebulization every 6 (six) hours as needed for wheezing or shortness of breath. 75 mL 0   albuterol (VENTOLIN HFA) 108 (90 Base) MCG/ACT inhaler Inhale 2 puffs into the lungs every 6 (six) hours as needed for wheezing or shortness of breath. 1 each 1   ALPRAZolam (XANAX) 0.5 MG tablet Take 0.5 mg by mouth daily as needed.     b complex vitamins capsule Take 1 capsule by mouth daily.     budesonide-formoterol (SYMBICORT) 80-4.5 MCG/ACT inhaler Inhale 2 puffs into the lungs 2 (two) times daily.     buPROPion (WELLBUTRIN XL) 150 MG 24 hr tablet TAKE 2 TABLETS (300 MG TOTAL) BY MOUTH DAILY. 180 tablet 3   Carboxymethylcellul-Glycerin (LUBRICATING EYE DROPS OP) Place 1 drop into both eyes daily as needed (dry eyes).     cariprazine (VRAYLAR) 1.5 MG capsule Take 1.5 mg by mouth daily.  colesevelam (WELCHOL) 625 MG tablet Take 1,250 mg by mouth 2 (two) times daily with a meal.     cyclobenzaprine (FLEXERIL) 10 MG tablet Take 1 tablet (10 mg total) by mouth 3 (three) times daily as needed for muscle spasms. 30 tablet 2   escitalopram (LEXAPRO) 20 MG tablet Take 1 tablet by mouth daily.     hydrOXYzine (ATARAX) 50 MG tablet Take 50-100 mg by mouth at bedtime.     ibuprofen (ADVIL) 200 MG tablet Take 400 mg by mouth every 6 (six) hours as needed for headache or moderate pain.     ipratropium (ATROVENT) 0.03 % nasal spray Place 2 sprays into both nostrils every 12 (twelve) hours. 30 mL 12   lansoprazole (PREVACID) 30 MG capsule Take 1 capsule (30 mg total) by mouth 2 (two) times daily before a meal. 120 capsule 2   rOPINIRole (REQUIP) 1 MG tablet Take 1 mg by mouth at bedtime.     Solriamfetol HCl (SUNOSI) 150 MG TABS Take 150 mg by mouth daily.     montelukast  (SINGULAIR) 10 MG tablet Take 1 tablet (10 mg total) by mouth at bedtime. (Patient not taking: Reported on 08/13/2023) 14 tablet 0   No current facility-administered medications for this visit.   Allergies  Allergen Reactions   Dust Mite Extract Other (See Comments) and Swelling    Client reports "dust makes my asthma worse" it can increase wheezing..  Other Reaction(s): Bronchospasm   Physical Exam: BP 132/76   Pulse 84   Ht 5\' 6"  (1.676 m)   Wt 280 lb (127 kg)   BMI 45.19 kg/m  Constitutional: Pleasant,well-developed, female in no acute distress. HEENT: Normocephalic and atraumatic. Conjunctivae are normal. No scleral icterus. Cardiovascular: Normal rate, regular rhythm.  Pulmonary/chest: Effort normal and breath sounds normal. No wheezing, rales or rhonchi. Abdominal: Soft, nondistended, nontender. Bowel sounds active throughout. There are no masses palpable. No hepatomegaly. Extremities: No edema Neurological: Alert and oriented to person place and time. Skin: Skin is warm and dry. No rashes noted. Psychiatric: Normal mood and affect. Behavior is normal.  Labs 12/2022: CBC unremarkable. CMP with mildly elevated ALT of 40.   Colonoscopy 11/09/18: Indication is IDA. 2 polyps in the ascending colon removed with cold biopsy forceps. Sigmoid diverticulosis. Internal hemorrhoids. Good prep. Follow up in 5 years.  Path: TA and HP  EGD 04/10/23: - LA Grade B esophagitis with no bleeding. Biopsied.  - Gastritis. Biopsied.  - Normal examined duodenum. Path: 1. Surgical [P], gastric biopsies GASTRIC ANTRAL / OXYNTIC MUCOSA WITH REACTIVE CHANGES. NO H. PYLORI IDENTIFIED ON H&E. NEGATIVE FOR INTESTINAL METAPLASIA OR DYSPLASIA. 2. Surgical [P], esophageal biopsies ESOPHAGEAL SQUAMOUS MUCOSA WITH NO SIGNIFICANT DIAGNOSTIC ALTERATION. NO EVIDENCE OF INTRAEPITHELIAL EOSINOPHILS OR LYMPHOCYTES.  Colonoscopy 04/10/23:  Path: 3. Surgical [P], colon, transverse, ascending and sigmoid,  polyp (5) FRAGMENTS OF TUBULAR ADENOMA. NEGATIVE FOR HIGH-GRADE DYSPLASIA.  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 twice twice daily but continues to have some issues with dysphagia.  I do wonder if she may have an underlying esophageal stricture so we will plan for an EGD with dilation.  I did offer to change the patient's PPI therapy or add on H2 blocker therapy but she would like to remain on the lansoprazole for now.  - Previously gave GERD handout - Lansoprazole 30 mg BID. Refilled.  - Next colonoscopy is  due in 03/2026 for polyp surveillance - EGD [facility]  Eulah Pont, MD  I spent 25 minutes of time, including in depth chart review, independent review of results as outlined above, communicating results with the patient directly, face-to-face time with the patient, coordinating care, ordering studies and medications as appropriate, and documentation.

## 2023-10-21 NOTE — Patient Instructions (Addendum)
You have been scheduled for an endoscopy. Please follow written instructions given to you at your visit today.  If you use inhalers (even only as needed), please bring them with you on the day of your procedure.  If you take any of the following medications, they will need to be adjusted prior to your procedure:   DO NOT TAKE 7 DAYS PRIOR TO TEST- Trulicity (dulaglutide) Ozempic, Wegovy (semaglutide) Mounjaro (tirzepatide) Bydureon Bcise (exanatide extended release)  DO NOT TAKE 1 DAY PRIOR TO YOUR TEST Rybelsus (semaglutide) Adlyxin (lixisenatide) Victoza (liraglutide) Byetta (exanatide) ___________________________________________________________________________  We have sent the following medications to your pharmacy for you to pick up at your convenience: Lansoprazole  _______________________________________________________  If your blood pressure at your visit was 140/90 or greater, please contact your primary care physician to follow up on this.  _______________________________________________________  If you are age 50 or older, your body mass index should be between 23-30. Your Body mass index is 45.19 kg/m. If this is out of the aforementioned range listed, please consider follow up with your Primary Care Provider.  If you are age 34 or younger, your body mass index should be between 19-25. Your Body mass index is 45.19 kg/m. If this is out of the aformentioned range listed, please consider follow up with your Primary Care Provider.   ________________________________________________________  The [facility] providers would like to encourage you to use [facility] to communicate with providers for non-urgent requests or questions.  Due to long hold times on the telephone, sending your provider a message by [facility] may be a faster and more efficient way to get a response.  Please allow 48 business hours for a response.  Please remember that this is for non-urgent requests.   _______________________________________________________   Due to recent changes in healthcare laws, you may see the results of your imaging and laboratory studies on MyChart before your provider has had a chance to review them.  We understand that in some cases there may be results that are confusing or concerning to you. Not all laboratory results come back in the same time frame and the provider may be waiting for multiple results in order to interpret others.  Please give [place] 48 hours in order for your provider to thoroughly review all the results before contacting the office for clarification of your results.   Thank you for entrusting me with your care and for choosing [facility], Dr. Eulah Pont

## 2023-10-22 DIAGNOSIS — F321 Major depressive disorder, single episode, moderate: Secondary | ICD-10-CM | POA: Diagnosis not present

## 2023-10-29 DIAGNOSIS — F321 Major depressive disorder, single episode, moderate: Secondary | ICD-10-CM | POA: Diagnosis not present

## 2023-11-03 ENCOUNTER — Encounter: Payer: Self-pay | Admitting: Nurse Practitioner

## 2023-11-05 DIAGNOSIS — F321 Major depressive disorder, single episode, moderate: Secondary | ICD-10-CM | POA: Diagnosis not present

## 2023-11-07 ENCOUNTER — Ambulatory Visit: Payer: 59 | Admitting: Nurse Practitioner

## 2023-11-07 ENCOUNTER — Other Ambulatory Visit (HOSPITAL_BASED_OUTPATIENT_CLINIC_OR_DEPARTMENT_OTHER): Payer: Self-pay

## 2023-11-07 ENCOUNTER — Encounter: Payer: Self-pay | Admitting: Nurse Practitioner

## 2023-11-07 VITALS — BP 126/88 | HR 88 | Temp 97.4°F | Ht 66.0 in | Wt 280.8 lb

## 2023-11-07 DIAGNOSIS — M7711 Lateral epicondylitis, right elbow: Secondary | ICD-10-CM | POA: Insufficient documentation

## 2023-11-07 DIAGNOSIS — R1310 Dysphagia, unspecified: Secondary | ICD-10-CM

## 2023-11-07 MED ORDER — WEGOVY 0.25 MG/0.5ML ~~LOC~~ SOAJ
0.2500 mg | SUBCUTANEOUS | 0 refills | Status: DC
Start: 2023-11-07 — End: 2023-11-18
  Filled 2023-11-07: qty 2, 28d supply, fill #0

## 2023-11-07 MED ORDER — NAPROXEN 500 MG PO TABS: 500.0000 mg | ORAL_TABLET | Freq: Two times a day (BID) | ORAL | 0 refills | Status: DC

## 2023-11-07 NOTE — Assessment & Plan Note (Signed)
She continues to experience persistent swallowing difficulties despite Prevacid, with an endoscopy and planned esophageal dilation upcoming. No changes to the current management plan are made at this time.

## 2023-11-07 NOTE — Progress Notes (Signed)
Established Patient Office Visit  Subjective   Patient ID: Maria Clark, female    DOB: 08-May-1971  Age: 52 y.o. MRN: [id number]  Chief Complaint  Patient presents with   Right Forearm and Elbow Pain    Pain for 1 month, discuss weight management of Wegovy    HPI  Discussed the use of AI scribe software for clinical note transcription with the patient, who gave verbal consent to proceed.  History of Present Illness   The patient, with a history of difficulty swallowing and weight issues, presents with pain in the upper forearm. She believes the pain is due to the physical strain of pushing and lifting a wheelchair. The pain has been ongoing and is now affecting her grip strength, causing her to drop items. The pain persists even at rest and is not relieved by Tylenol or ibuprofen. The patient has not noticed any swelling or bruising in the area.  In addition to the forearm pain, the patient is interested in starting [facility], a weight loss medication. She has found a coupon program that might make the medication more affordable. The patient also mentions ongoing issues with swallowing, even while on Prevacid, and has scheduled another endoscopy.        ROS See pertinent positives and negatives per HPI.    Objective:     BP 126/88 (BP Location: Left Arm)   Pulse 88   Temp (!) 97.4 F (36.3 C)   Ht 5\' 6"  (1.676 m)   Wt 280 lb 12.8 oz (127.4 kg)   SpO2 98%   BMI 45.32 kg/m  BP Readings from Last 3 Encounters:  11/07/23 126/88  10/21/23 132/76  08/13/23 124/82   Wt Readings from Last 3 Encounters:  11/07/23 280 lb 12.8 oz (127.4 kg)  10/21/23 280 lb (127 kg)  08/13/23 271 lb 3.2 oz (123 kg)      Physical Exam Vitals and nursing note reviewed.  Constitutional:      General: She is not in acute distress.    Appearance: Normal appearance.  HENT:     Head: Normocephalic.  Eyes:     Conjunctiva/sclera: Conjunctivae normal.  Pulmonary:     Effort:  Pulmonary effort is normal.  Musculoskeletal:        General: Swelling (trace) and tenderness (right lateral elbow) present. Normal range of motion.     Cervical back: Normal range of motion.     Comments: Grip strength equal bilaterally   Skin:    General: Skin is warm.  Neurological:     General: No focal deficit present.     Mental Status: She is alert and oriented to person, place, and time.  Psychiatric:        Mood and Affect: Mood normal.        Behavior: Behavior normal.        Thought Content: Thought content normal.        Judgment: Judgment normal.     The 10-year ASCVD risk score (Arnett DK, et al., 2019) is: 1.2%    Assessment & Plan:   Problem List Items Addressed This Visit       Digestive   Dysphagia    She continues to experience persistent swallowing difficulties despite Prevacid, with an endoscopy and planned esophageal dilation upcoming. No changes to the current management plan are made at this time.        Musculoskeletal and Integument   Lateral epicondylitis of right elbow - Primary  She exhibits pain in the upper forearm and decreased grip strength, likely from repetitive strain. Slight swelling is present. We will start daily exercises and apply ice intermittently throughout the day. Naproxen 500mg  will be started twice a day with food, and an arm strap will be considered for pain and weakness. Voltaren gel four times a day for pain relief is also an option. Follow-up is scheduled in 6-8 weeks or sooner if symptoms do not improve.      Relevant Medications   naproxen (NAPROSYN) 500 MG tablet     Other   Morbid obesity (HCC)    She is interested in starting [facility] for weight loss and has found a coupon program to assist with the cost. LOVFIE 0.25mg  weekly will be initiated. If tolerated, the dose will increase to weekly until she reaches a maintenance dose of 1.7-2.4mg  weekly. A follow-up in 6-8 weeks will assess tolerance and effectiveness.  Continue exercise and choosing smaller portions. Discussed possible side effects.       Relevant Medications   Semaglutide-Weight Management (WEGOVY) 0.25 MG/0.5ML SOAJ    Return in about 6 weeks (around 12/19/2023) for weight management, arm pain .    Gerre Scull, NP

## 2023-11-07 NOTE — Assessment & Plan Note (Signed)
She exhibits pain in the upper forearm and decreased grip strength, likely from repetitive strain. Slight swelling is present. We will start daily exercises and apply ice intermittently throughout the day. Naproxen 500mg  will be started twice a day with food, and an arm strap will be considered for pain and weakness. Voltaren gel four times a day for pain relief is also an option. Follow-up is scheduled in 6-8 weeks or sooner if symptoms do not improve.

## 2023-11-07 NOTE — Patient Instructions (Signed)
It was great to see you!  Start the attached exercises daily.   Start using ice intermittently throughout the day.   Start naproxen twice a day with food   You can get an arm strap to help with the pain and weakness.   You can also voltaren gel 4 times a day to help with pain.   Start wegovy 0.25mg  injection once a week.   Let's follow-up in 6-8 weeks, sooner if you have concerns.  If a referral was placed today, you will be contacted for an appointment. Please note that routine referrals can sometimes take up to 3-4 weeks to process. Please call our office if you haven't heard anything after this time frame.  Take care,  Rodman Pickle, NP

## 2023-11-07 NOTE — Assessment & Plan Note (Signed)
She is interested in starting [facility] for weight loss and has found a coupon program to assist with the cost. GNFAOZ 0.25mg  weekly will be initiated. If tolerated, the dose will increase to weekly until she reaches a maintenance dose of 1.7-2.4mg  weekly. A follow-up in 6-8 weeks will assess tolerance and effectiveness. Continue exercise and choosing smaller portions. Discussed possible side effects.

## 2023-11-10 ENCOUNTER — Other Ambulatory Visit (HOSPITAL_BASED_OUTPATIENT_CLINIC_OR_DEPARTMENT_OTHER): Payer: Self-pay

## 2023-11-10 ENCOUNTER — Encounter: Payer: Self-pay | Admitting: Nurse Practitioner

## 2023-11-10 ENCOUNTER — Other Ambulatory Visit (HOSPITAL_COMMUNITY): Payer: Self-pay

## 2023-11-10 ENCOUNTER — Telehealth: Payer: Self-pay

## 2023-11-10 NOTE — Telephone Encounter (Signed)
*  Primary  Pharmacy Patient Advocate Encounter   Received notification from CoverMyMeds that prior authorization for Wegovy 0.25MG /0.5ML auto-injectors  is required/requested.   Insurance verification completed.   The patient is insured through [employer] [facility] .   Per test claim: PA required; PA submitted to above mentioned insurance via CoverMyMeds Key/confirmation #/EOC [id number] Status is pending

## 2023-11-11 ENCOUNTER — Other Ambulatory Visit (HOSPITAL_BASED_OUTPATIENT_CLINIC_OR_DEPARTMENT_OTHER): Payer: Self-pay

## 2023-11-12 DIAGNOSIS — F321 Major depressive disorder, single episode, moderate: Secondary | ICD-10-CM | POA: Diagnosis not present

## 2023-11-14 ENCOUNTER — Other Ambulatory Visit (HOSPITAL_BASED_OUTPATIENT_CLINIC_OR_DEPARTMENT_OTHER): Payer: Self-pay

## 2023-11-17 ENCOUNTER — Encounter: Payer: Self-pay | Admitting: Nurse Practitioner

## 2023-11-17 ENCOUNTER — Other Ambulatory Visit (HOSPITAL_BASED_OUTPATIENT_CLINIC_OR_DEPARTMENT_OTHER): Payer: Self-pay

## 2023-11-17 NOTE — Telephone Encounter (Signed)
Pharmacy Patient Advocate Encounter  Received notification from [employer] [facility] that Prior Authorization for [facility] 0.25mg /0.23ml has been DENIED.  See denial reason below. No denial letter attached in CMM. Will attach denial letter to Media tab once received.   PA #/Case ID/Reference #: [id number]

## 2023-11-18 ENCOUNTER — Encounter: Payer: Self-pay | Admitting: Internal Medicine

## 2023-11-18 MED ORDER — WEGOVY 0.25 MG/0.5ML ~~LOC~~ SOAJ: 0.2500 mg | SUBCUTANEOUS | 0 refills | Status: DC

## 2023-11-18 NOTE — Addendum Note (Signed)
Addended by: Rodman Pickle A on: 11/18/2023 08:40 AM   Modules accepted: Orders

## 2023-11-19 DIAGNOSIS — F321 Major depressive disorder, single episode, moderate: Secondary | ICD-10-CM | POA: Diagnosis not present

## 2023-11-21 ENCOUNTER — Other Ambulatory Visit: Payer: Self-pay | Admitting: Medical Genetics

## 2023-11-21 NOTE — Telephone Encounter (Signed)
Patient is aware and will be using a coupon for cash pay.

## 2023-11-26 DIAGNOSIS — F321 Major depressive disorder, single episode, moderate: Secondary | ICD-10-CM | POA: Diagnosis not present

## 2023-11-28 ENCOUNTER — Ambulatory Visit (AMBULATORY_SURGERY_CENTER): Payer: 59 | Admitting: Internal Medicine

## 2023-11-28 ENCOUNTER — Encounter: Payer: Self-pay | Admitting: Internal Medicine

## 2023-11-28 VITALS — BP 134/90 | HR 80 | Temp 98.1°F | Resp 21 | Ht 66.0 in | Wt 280.0 lb

## 2023-11-28 DIAGNOSIS — K2970 Gastritis, unspecified, without bleeding: Secondary | ICD-10-CM | POA: Diagnosis not present

## 2023-11-28 DIAGNOSIS — F32A Depression, unspecified: Secondary | ICD-10-CM | POA: Diagnosis not present

## 2023-11-28 DIAGNOSIS — F419 Anxiety disorder, unspecified: Secondary | ICD-10-CM | POA: Diagnosis not present

## 2023-11-28 DIAGNOSIS — R1310 Dysphagia, unspecified: Secondary | ICD-10-CM | POA: Diagnosis not present

## 2023-11-28 DIAGNOSIS — G4730 Sleep apnea, unspecified: Secondary | ICD-10-CM | POA: Diagnosis not present

## 2023-11-28 DIAGNOSIS — K222 Esophageal obstruction: Secondary | ICD-10-CM

## 2023-11-28 MED ORDER — SODIUM CHLORIDE 0.9 % IV SOLN: 500.0000 mL | Freq: Once | INTRAVENOUS | Status: DC

## 2023-11-28 NOTE — Progress Notes (Signed)
GASTROENTEROLOGY PROCEDURE H&P NOTE   Primary Care Physician: Gerre Scull, NP    Reason for Procedure:   Dysphagia, GERD  Plan:    EGD  Patient is appropriate for endoscopic procedure(s) in the ambulatory ([facility]) setting.  The nature of the procedure, as well as the risks, benefits, and alternatives were carefully and thoroughly reviewed with the patient. Ample time for discussion and questions allowed. The patient understood, was satisfied, and agreed to proceed.     HPI: Maria Clark is a 52 y.o. female who presents for EGD for evaluation of dysphagia and GERD.  Patient was most recently seen in the Gastroenterology Clinic on 10/21/23.  No interval change in medical history since that appointment. Please refer to that note for full details regarding GI history and clinical presentation.   Past Medical History:  Diagnosis Date   Anemia    Anxiety    Asthma    Complication of anesthesia    woozy out of it for more than a day previously   Depression    Endometriosis    Epstein Barr infection    Mental disorder    Depression   PCOS (polycystic ovarian syndrome)    Sleep apnea     Past Surgical History:  Procedure Laterality Date   ANTERIOR CRUCIATE LIGAMENT REPAIR     right x 2, left x 1   CHOLECYSTECTOMY     LAPAROSCOPY     to determine endometriosis    Prior to Admission medications   Medication Sig Start Date End Date Taking? Authorizing Provider  albuterol (VENTOLIN HFA) 108 (90 Base) MCG/ACT inhaler Inhale 2 puffs into the lungs every 6 (six) hours as needed for wheezing or shortness of breath. 12/12/21  Yes Tower, Audrie Gallus, MD  b complex vitamins capsule Take 1 capsule by mouth daily.   Yes [provider]  budesonide-formoterol (SYMBICORT) 80-4.5 MCG/ACT inhaler Inhale 2 puffs into the lungs 2 (two) times daily.   Yes [provider]  buPROPion (WELLBUTRIN XL) 150 MG 24 hr tablet TAKE 2 TABLETS (300 MG TOTAL) BY MOUTH DAILY.  08/06/21  Yes Reva Bores, MD  cariprazine (VRAYLAR) 1.5 MG capsule Take 1.5 mg by mouth daily.   Yes [provider]  colesevelam (WELCHOL) 625 MG tablet Take 1,250 mg by mouth 2 (two) times daily with a meal.   Yes [provider]  escitalopram (LEXAPRO) 20 MG tablet Take 1 tablet by mouth daily. 08/02/22  Yes [provider]  hydrOXYzine (ATARAX) 50 MG tablet Take 50-100 mg by mouth at bedtime. 03/14/23  Yes [provider]  ibuprofen (ADVIL) 200 MG tablet Take 400 mg by mouth every 6 (six) hours as needed for headache or moderate pain.   Yes [provider]  ipratropium (ATROVENT) 0.03 % nasal spray Place 2 sprays into both nostrils every 12 (twelve) hours. 12/03/22  Yes McElwee, Lauren A, NP  lansoprazole (PREVACID) 30 MG capsule Take 1 capsule (30 mg total) by mouth 2 (two) times daily before a meal. 10/21/23  Yes Imogene Burn, MD  naproxen (NAPROSYN) 500 MG tablet Take 1 tablet (500 mg total) by mouth 2 (two) times daily with a meal. 11/07/23  Yes McElwee, Lauren A, NP  rOPINIRole (REQUIP) 1 MG tablet Take 1 mg by mouth at bedtime. 04/02/22  Yes [provider]  Solriamfetol HCl (SUNOSI) 150 MG TABS Take 150 mg by mouth daily.   Yes [provider]  albuterol (PROVENTIL) (2.5 MG/3ML) 0.083%  nebulizer solution Take 3 mLs (2.5 mg total) by nebulization every 6 (six) hours as needed for wheezing or shortness of breath. 07/04/23   Crain, Whitney L, PA  ALPRAZolam (XANAX) 0.5 MG tablet Take 0.5 mg by mouth daily as needed. 11/12/22   [provider]  cyclobenzaprine (FLEXERIL) 10 MG tablet Take 1 tablet (10 mg total) by mouth 3 (three) times daily as needed for muscle spasms. 09/04/22   McElwee, Lauren A, NP  montelukast (SINGULAIR) 10 MG tablet Take 1 tablet (10 mg total) by mouth at bedtime. Patient not taking: Reported on 08/13/2023 07/04/23   Maretta Bees, PA  Semaglutide-Weight Management (WEGOVY) 0.25 MG/0.5ML SOAJ Inject  0.25 mg into the skin once a week. 11/18/23   McElwee, Jake Church, NP    Current Outpatient Medications  Medication Sig Dispense Refill   albuterol (VENTOLIN HFA) 108 (90 Base) MCG/ACT inhaler Inhale 2 puffs into the lungs every 6 (six) hours as needed for wheezing or shortness of breath. 1 each 1   b complex vitamins capsule Take 1 capsule by mouth daily.     budesonide-formoterol (SYMBICORT) 80-4.5 MCG/ACT inhaler Inhale 2 puffs into the lungs 2 (two) times daily.     buPROPion (WELLBUTRIN XL) 150 MG 24 hr tablet TAKE 2 TABLETS (300 MG TOTAL) BY MOUTH DAILY. 180 tablet 3   cariprazine (VRAYLAR) 1.5 MG capsule Take 1.5 mg by mouth daily.     colesevelam (WELCHOL) 625 MG tablet Take 1,250 mg by mouth 2 (two) times daily with a meal.     escitalopram (LEXAPRO) 20 MG tablet Take 1 tablet by mouth daily.     hydrOXYzine (ATARAX) 50 MG tablet Take 50-100 mg by mouth at bedtime.     ibuprofen (ADVIL) 200 MG tablet Take 400 mg by mouth every 6 (six) hours as needed for headache or moderate pain.     ipratropium (ATROVENT) 0.03 % nasal spray Place 2 sprays into both nostrils every 12 (twelve) hours. 30 mL 12   lansoprazole (PREVACID) 30 MG capsule Take 1 capsule (30 mg total) by mouth 2 (two) times daily before a meal. 120 capsule 2   naproxen (NAPROSYN) 500 MG tablet Take 1 tablet (500 mg total) by mouth 2 (two) times daily with a meal. 30 tablet 0   rOPINIRole (REQUIP) 1 MG tablet Take 1 mg by mouth at bedtime.     Solriamfetol HCl (SUNOSI) 150 MG TABS Take 150 mg by mouth daily.     albuterol (PROVENTIL) (2.5 MG/3ML) 0.083% nebulizer solution Take 3 mLs (2.5 mg total) by nebulization every 6 (six) hours as needed for wheezing or shortness of breath. 75 mL 0   ALPRAZolam (XANAX) 0.5 MG tablet Take 0.5 mg by mouth daily as needed.     cyclobenzaprine (FLEXERIL) 10 MG tablet Take 1 tablet (10 mg total) by mouth 3 (three) times daily as needed for muscle spasms. 30 tablet 2   montelukast (SINGULAIR) 10 MG  tablet Take 1 tablet (10 mg total) by mouth at bedtime. (Patient not taking: Reported on 08/13/2023) 14 tablet 0   Semaglutide-Weight Management (WEGOVY) 0.25 MG/0.5ML SOAJ Inject 0.25 mg into the skin once a week. 2 mL 0   Current Facility-Administered Medications  Medication Dose Route Frequency Provider Last Rate Last Admin   0.9 %  sodium chloride infusion  500 mL Intravenous Once Imogene Burn, MD        Allergies as of 11/28/2023 - Review Complete 11/28/2023  Allergen Reaction Noted  Dust mite extract Other (See Comments) and Swelling 07/04/2022    Family History  Problem Relation Age of Onset   Hypertension Mother    Arthritis Mother    Benign prostatic hyperplasia Father    Aneurysm Paternal Aunt        cardiac - non-smoker   Aneurysm Paternal Grandfather        stomach - smoker   Polycystic ovary syndrome Neg Hx    Colon cancer Neg Hx    Esophageal cancer Neg Hx    Rectal cancer Neg Hx    Stomach cancer Neg Hx     Social History   Socioeconomic History   Marital status: Divorced    Spouse name: Jae Dire   Number of children: 0   Years of education: Master's Degree   Highest education level: Master's degree (e.g., MA, MS, MEng, MEd, MSW, MBA)  Occupational History   Not on file  Tobacco Use   Smoking status: Never    Passive exposure: Never   Smokeless tobacco: Never  Vaping Use   Vaping status: Never Used  Substance and Sexual Activity   Alcohol use: Yes    Comment: rarely   Drug use: Never   Sexual activity: Not on file    Comment: female partners  Other Topics Concern   Not on file  Social History Narrative      Social Drivers of Health   Financial Resource Strain: Low Risk  (11/04/2023)   Overall Financial Resource Strain (CARDIA)    Difficulty of Paying Living Expenses: Not hard at all  Food Insecurity: No Food Insecurity (11/04/2023)   Hunger Vital Sign    Worried About Running Out of Food in the Last Year: Never true    Ran Out of Food in  the Last Year: Never true  Transportation Needs: No Transportation Needs (11/04/2023)   PRAPARE - Administrator, Civil Service (Medical): No    Lack of Transportation (Non-Medical): No  Physical Activity: Inactive (11/04/2023)   Exercise Vital Sign    Days of Exercise per Week: 0 days    Minutes of Exercise per Session: 10 min  Stress: Stress Concern Present (11/04/2023)   [employer] of Occupational Health - Occupational Stress Questionnaire    Feeling of Stress : Very much  Social Connections: Socially Isolated (11/04/2023)   Social Connection and Isolation Panel [NHANES]    Frequency of Communication with Friends and Family: Once a week    Frequency of Social Gatherings with Friends and Family: Once a week    Attends Religious Services: Never    Database administrator or Organizations: Yes    Attends Engineer, structural: More than 4 times per year    Marital Status: Divorced  Intimate Partner Violence: Unknown (07/28/2023)   Received from [facility]   HITS    Physically Hurt: Not on file    Insult or Talk Down To: Not on file    Threaten Physical Harm: Not on file    Scream or Curse: Not on file    Physical Exam: Vital signs in last 24 hours: BP 116/78   Pulse 70   Temp 98.1 F (36.7 C)   Ht 5\' 6"  (1.676 m)   Wt 280 lb (127 kg)   SpO2 97%   BMI 45.19 kg/m  GEN: NAD EYE: Sclerae anicteric ENT: MMM CV: Non-tachycardic Pulm: No increased WOB GI: Soft NEURO:  Alert & Oriented   Eulah Pont, MD [facility]  11/28/2023 2:28 PM

## 2023-11-28 NOTE — Progress Notes (Signed)
Sedate, gd SR, tolerated procedure well, VSS, report to RN 

## 2023-11-28 NOTE — Op Note (Signed)
[facility] Patient Name: Maria Clark Procedure Date: 11/28/2023 2:27 PM MRN: [id number] Endoscopist: Particia Lather , , [id number] Age: 52 Referring MD:  Date of Birth: 1971/11/03 Gender: Female Account #: [id number] Procedure:                Upper GI endoscopy Indications:              Dysphagia, Heartburn Medicines:                Monitored Anesthesia Care Procedure:                Pre-Anesthesia Assessment:                           - Prior to the procedure, a History and Physical                            was performed, and patient medications and                            allergies were reviewed. The patient's tolerance of                            previous anesthesia was also reviewed. The risks                            and benefits of the procedure and the sedation                            options and risks were discussed with the patient.                            All questions were answered, and informed consent                            was obtained. Prior Anticoagulants: The patient has                            taken no anticoagulant or antiplatelet agents. ASA                            Grade Assessment: III - A patient with severe                            systemic disease. After reviewing the risks and                            benefits, the patient was deemed in satisfactory                            condition to undergo the procedure.                           After obtaining informed consent, the endoscope was  passed under direct vision. Throughout the                            procedure, the patient's blood pressure, pulse, and                            oxygen saturations were monitored continuously. The                            Olympus scope [id number] was introduced through the                            mouth, and advanced to the second part of duodenum.                            The upper GI  endoscopy was accomplished without                            difficulty. The patient tolerated the procedure                            well. Scope In: Scope Out: Findings:                 Two benign-appearing, intrinsic moderate                            (circumferential scarring or stenosis; an endoscope                            may pass) stenoses were found (one in the proximal                            esophagus and one at the GE junction). The                            narrowest stenosis measured less than one cm (in                            length). The stenoses were traversed. A guidewire                            was placed and the scope was withdrawn. Dilation                            was performed with a Savary dilator with mild                            resistance at 19 mm. The dilation site was examined                            following endoscope reinsertion and showed mild  mucosal disruption.                           Localized inflammation characterized by congestion                            (edema), erosions and erythema was found in the                            gastric antrum.                           The examined duodenum was normal. Complications:            No immediate complications. Estimated Blood Loss:     Estimated blood loss was minimal. Impression:               - Benign-appearing esophageal stenoses. Dilated.                           - Gastritis.                           - Normal examined duodenum.                           - No specimens collected. Recommendation:           - Discharge patient to home (with escort).                           - Okay to decrease Prevacid from BID to QD. Once                            you are on your new insurance, we can start the                            process of trying to get alternative antireflux                            medications approved.                            - Return to GI clinic in 2-3 months.                           - The findings and recommendations were discussed                            with the patient. Dr Particia Lather "Alan Ripper" Leonides Schanz,  11/28/2023 2:57:30 PM

## 2023-11-28 NOTE — Patient Instructions (Addendum)
Handouts provided about stricture and gastritis.    Dilation diet instructions provided. Per Dr. Leim Fabry to decrease Prevacid from BID to QD day(daily).   Once you are on your new insurance, we can start the process of trying to get alternative anti reflux medications approved.  Return to GI clinic in 2-3 months.     YOU HAD AN ENDOSCOPIC PROCEDURE TODAY AT THE [facility]:   Refer to the procedure report that was given to you for any specific questions about what was found during the examination.  If the procedure report does not answer your questions, please call your gastroenterologist to clarify.  If you requested that your care partner not be given the details of your procedure findings, then the procedure report has been included in a sealed envelope for you to review at your convenience later.  YOU SHOULD EXPECT: Some feelings of bloating in the abdomen. Passage of more gas than usual.  Walking can help get rid of the air that was put into your GI tract during the procedure and reduce the bloating. If you had a lower endoscopy (such as a colonoscopy or flexible sigmoidoscopy) you may notice spotting of blood in your stool or on the toilet paper. If you underwent a bowel prep for your procedure, you may not have a normal bowel movement for a few days.  Please Note:  You might notice some irritation and congestion in your nose or some drainage.  This is from the oxygen used during your procedure.  There is no need for concern and it should clear up in a day or so.  SYMPTOMS TO REPORT IMMEDIATELY:  Following lower endoscopy (colonoscopy or flexible sigmoidoscopy):  Excessive amounts of blood in the stool  Significant tenderness or worsening of abdominal pains  Swelling of the abdomen that is new, acute  Fever of 100F or higher  Following upper endoscopy (EGD)  Vomiting of blood or coffee ground material  New chest pain or pain under the shoulder blades  Painful or  persistently difficult swallowing  New shortness of breath  Fever of 100F or higher  Black, tarry-looking stools  For urgent or emergent issues, a gastroenterologist can be reached at any hour by calling [phone]. Do not use MyChart messaging for urgent concerns.    DIET:  We do recommend a small meal at first, but then you may proceed to your regular diet.  Drink plenty of fluids but you should avoid alcoholic beverages for 24 hours.  ACTIVITY:  You should plan to take it easy for the rest of today and you should NOT DRIVE or use heavy machinery until tomorrow (because of the sedation medicines used during the test).    FOLLOW UP: Our staff will call the number listed on your records the next business day following your procedure.  We will call around 7:15- 8:00 am to check on you and address any questions or concerns that you may have regarding the information given to you following your procedure. If we do not reach you, we will leave a message.     If any biopsies were taken you will be contacted by phone or by letter within the next 1-3 weeks.  Please call us at [phone] if you have not heard about the biopsies in 3 weeks.    SIGNATURES/CONFIDENTIALITY: You and/or your care partner have signed paperwork which will be entered into your electronic medical record.  These signatures attest to the fact that that  the information above on your After Visit Summary has been reviewed and is understood.  Full responsibility of the confidentiality of this discharge information lies with you and/or your care-partner.

## 2023-11-28 NOTE — Progress Notes (Signed)
Called to room to assist during endoscopic procedure.  Patient ID and intended procedure confirmed with present staff. Received instructions for my participation in the procedure from the performing physician.  

## 2023-11-28 NOTE — Progress Notes (Signed)
Pt's states no medical or surgical changes since previsit or office visit. 

## 2023-12-01 ENCOUNTER — Telehealth: Payer: Self-pay

## 2023-12-01 ENCOUNTER — Telehealth: Payer: Self-pay | Admitting: *Deleted

## 2023-12-01 NOTE — Telephone Encounter (Signed)
PT is calling to update [place] that she still has a very bad sore throat since EGD Friday. Please advise.

## 2023-12-01 NOTE — Telephone Encounter (Signed)
  Follow up Call-     11/28/2023    1:55 PM 04/10/2023    1:12 PM  Call back number  Post procedure Call Back phone  # [phone]  Permission to leave phone message Yes Yes     Patient questions:  Do you have a fever, pain , or abdominal swelling? No. Pain Score  0 *  Have you tolerated food without any problems? Yes.    Have you been able to return to your normal activities? Yes.    Do you have any questions about your discharge instructions: Diet   No. Medications  No. Follow up visit  No.  Do you have questions or concerns about your Care? No.  Actions: * If pain score is 4 or above: No action needed, pain <4.

## 2023-12-01 NOTE — Telephone Encounter (Signed)
Returned the patient's phone call. She is still having some pain 4/10 after he Dilation on Friday. She is eating and drinking fine. Encouraged her to use some Chloraseptic spray and or cough drops to help with the pain and to notify us if it doesn't improve.

## 2023-12-02 DIAGNOSIS — F411 Generalized anxiety disorder: Secondary | ICD-10-CM | POA: Diagnosis not present

## 2023-12-02 DIAGNOSIS — F330 Major depressive disorder, recurrent, mild: Secondary | ICD-10-CM | POA: Diagnosis not present

## 2023-12-03 DIAGNOSIS — F321 Major depressive disorder, single episode, moderate: Secondary | ICD-10-CM | POA: Diagnosis not present

## 2023-12-13 ENCOUNTER — Other Ambulatory Visit: Payer: Self-pay | Admitting: Nurse Practitioner

## 2023-12-16 ENCOUNTER — Other Ambulatory Visit (HOSPITAL_COMMUNITY): Payer: Self-pay

## 2023-12-16 NOTE — Telephone Encounter (Signed)
Requesting: IPRATROPIUM 0.03% SPRAY  Last Visit: 11/07/2023 Next Visit: 12/23/2023 Last Refill: 12/03/2022  Please Advise

## 2023-12-23 ENCOUNTER — Ambulatory Visit (INDEPENDENT_AMBULATORY_CARE_PROVIDER_SITE_OTHER): Payer: BC Managed Care – PPO | Admitting: Nurse Practitioner

## 2023-12-23 ENCOUNTER — Encounter: Payer: Self-pay | Admitting: Nurse Practitioner

## 2023-12-23 VITALS — BP 122/80 | HR 89 | Temp 97.0°F | Ht 66.0 in | Wt 288.0 lb

## 2023-12-23 DIAGNOSIS — M7711 Lateral epicondylitis, right elbow: Secondary | ICD-10-CM

## 2023-12-23 DIAGNOSIS — J4530 Mild persistent asthma, uncomplicated: Secondary | ICD-10-CM

## 2023-12-23 MED ORDER — WEGOVY 0.25 MG/0.5ML ~~LOC~~ SOAJ: 0.2500 mg | SUBCUTANEOUS | 0 refills | Status: DC

## 2023-12-23 MED ORDER — FLUTICASONE-SALMETEROL 250-50 MCG/ACT IN AEPB: 1.0000 | INHALATION_SPRAY | Freq: Two times a day (BID) | RESPIRATORY_TRACT | 1 refills | Status: DC

## 2023-12-23 NOTE — Assessment & Plan Note (Signed)
 The elbow pain is likely related to repetitive strain from holding a cell phone and laptop. Despite inconsistent use of a band, Voltaren gel, and naproxen , we encourage consistent use of these treatments. She is advised to keep her arm straight while sleeping and consider a steroid injection if symptoms persist.

## 2023-12-23 NOTE — Assessment & Plan Note (Addendum)
 Chronic, not controlled. She reports inconsistent relief with Symbicort and no wheezing was noted on examination. We will discontinue Symbicort and start a Wixela inhaler twice a day. Continue albuterol  inhaler as needed. Keep next scheduled follow-up appointment in 2 months.

## 2023-12-23 NOTE — Assessment & Plan Note (Signed)
 BMI 46.5. She reports continued weight gain. After discussing weight loss medications including Wegovy  and Zepbound , and ruling out Ozempic  due to its diabetes-specific indication, we will start Wegovy  0.25mg  injection once a week for weight loss. She is encouraged to maintain a healthy diet and increase fluid intake.

## 2023-12-23 NOTE — Progress Notes (Signed)
 Established Patient Office Visit  Subjective   Patient ID: Maria Clark, female    DOB: May 10, 1971  Age: 53 y.o. MRN: [id number]  Chief Complaint  Patient presents with   Weight Management    Follow up, discuss switching asthma inhaler    HPI  Discussed the use of AI scribe software for clinical note transcription with the patient, who gave verbal consent to proceed.  History of Present Illness   The patient, with a history of asthma and weight gain, presents with concerns about her current asthma medication, Symbicort, and expresses interest in starting Ozempic  for weight loss. She reports that Symbicort is not always effective, leading to occasional wheezing. She has been needing to use her albuterol  inhaler slightly more often. She states that her breathing is ok now and does not have any recent triggers to make her asthma worse.    She also expresses frustration with ongoing weight gain and is interested in starting Ozempic , since her insurance covers this. She has been trying to watch what she is eating, limit portion sizes and walk, however it has been harder with the recent holidays.   In addition to these concerns, the patient also reports ongoing issues with her right elbow, which she believes is due to holding a cell phone and laptop. She has tried using a band and Voltaren gel for relief, but has not been consistent with these treatments.      ROS See pertinent positives and negatives per HPI.    Objective:     BP 122/80 (BP Location: Right Arm, Patient Position: Sitting, Cuff Size: Normal)   Pulse 89   Temp (!) 97 F (36.1 C)   Ht 5' 6 (1.676 m)   Wt 288 lb (130.6 kg)   SpO2 96%   BMI 46.48 kg/m  BP Readings from Last 3 Encounters:  12/23/23 122/80  11/28/23 (!) 134/90  11/07/23 126/88   Wt Readings from Last 3 Encounters:  12/23/23 288 lb (130.6 kg)  11/28/23 280 lb (127 kg)  11/07/23 280 lb 12.8 oz (127.4 kg)      Physical Exam Vitals  and nursing note reviewed.  Constitutional:      General: She is not in acute distress.    Appearance: Normal appearance.  HENT:     Head: Normocephalic.  Eyes:     Conjunctiva/sclera: Conjunctivae normal.  Cardiovascular:     Rate and Rhythm: Normal rate and regular rhythm.     Pulses: Normal pulses.     Heart sounds: Normal heart sounds.  Pulmonary:     Effort: Pulmonary effort is normal.     Breath sounds: Normal breath sounds.  Musculoskeletal:     Cervical back: Normal range of motion.  Skin:    General: Skin is warm.  Neurological:     General: No focal deficit present.     Mental Status: She is alert and oriented to person, place, and time.  Psychiatric:        Mood and Affect: Mood normal.        Behavior: Behavior normal.        Thought Content: Thought content normal.        Judgment: Judgment normal.    The 10-year ASCVD risk score (Arnett DK, et al., 2019) is: 1.2%    Assessment & Plan:   Problem List Items Addressed This Visit       Respiratory   Asthma - Primary   Chronic, not controlled. She reports inconsistent  relief with Symbicort and no wheezing was noted on examination. We will discontinue Symbicort and start a Wixela inhaler twice a day. Continue albuterol  inhaler as needed. Keep next scheduled follow-up appointment in 2 months.       Relevant Medications   fluticasone -salmeterol (WIXELA INHUB) 250-50 MCG/ACT AEPB     Musculoskeletal and Integument   Lateral epicondylitis of right elbow   The elbow pain is likely related to repetitive strain from holding a cell phone and laptop. Despite inconsistent use of a band, Voltaren gel, and naproxen , we encourage consistent use of these treatments. She is advised to keep her arm straight while sleeping and consider a steroid injection if symptoms persist.         Other   Morbid obesity (HCC)   BMI 46.5. She reports continued weight gain. After discussing weight loss medications including Wegovy  and  Zepbound , and ruling out Ozempic  due to its diabetes-specific indication, we will start Wegovy  0.25mg  injection once a week for weight loss. She is encouraged to maintain a healthy diet and increase fluid intake.      Relevant Medications   Semaglutide -Weight Management (WEGOVY ) 0.25 MG/0.5ML SOAJ     Return if symptoms worsen or fail to improve.    Tinnie DELENA Harada, NP

## 2023-12-23 NOTE — Patient Instructions (Signed)
 It was great to see you!  Try to keep your arm straight while sleeping  Stop the symbicort and start Wixela inhaler twice a day.   Start wegovy  once a week.   Let's follow-up at your next scheduled appointment, sooner if you have concerns.  If a referral was placed today, you will be contacted for an appointment. Please note that routine referrals can sometimes take up to 3-4 weeks to process. Please call our office if you haven't heard anything after this time frame.  Take care,  Tinnie Harada, NP

## 2023-12-26 ENCOUNTER — Other Ambulatory Visit (HOSPITAL_COMMUNITY)
Admission: RE | Admit: 2023-12-26 | Discharge: 2023-12-26 | Disposition: A | Discharge status: Home / Self Care | Payer: Self-pay | Source: Ambulatory Visit | Attending: Medical Genetics | Admitting: Medical Genetics

## 2024-01-06 LAB — GENECONNECT MOLECULAR SCREEN: Genetic Analysis Overall Interpretation: NEGATIVE

## 2024-01-11 ENCOUNTER — Encounter (HOSPITAL_BASED_OUTPATIENT_CLINIC_OR_DEPARTMENT_OTHER): Payer: Self-pay | Admitting: Emergency Medicine

## 2024-01-11 ENCOUNTER — Emergency Department (HOSPITAL_BASED_OUTPATIENT_CLINIC_OR_DEPARTMENT_OTHER)
Admission: EM | Admit: 2024-01-11 | Discharge: 2024-01-11 | Disposition: A | Discharge status: Home / Self Care | Payer: BC Managed Care – PPO | Attending: Emergency Medicine | Admitting: Emergency Medicine

## 2024-01-11 DIAGNOSIS — A084 Viral intestinal infection, unspecified: Secondary | ICD-10-CM | POA: Insufficient documentation

## 2024-01-11 DIAGNOSIS — Z20822 Contact with and (suspected) exposure to covid-19: Secondary | ICD-10-CM | POA: Diagnosis not present

## 2024-01-11 DIAGNOSIS — S61431A Puncture wound without foreign body of right hand, initial encounter: Secondary | ICD-10-CM | POA: Diagnosis present

## 2024-01-11 DIAGNOSIS — W5501XA Bitten by cat, initial encounter: Secondary | ICD-10-CM | POA: Diagnosis not present

## 2024-01-11 LAB — COMPREHENSIVE METABOLIC PANEL
ALT: 41 U/L (ref 0–44)
AST: 20 U/L (ref 15–41)
Albumin: 4.1 g/dL (ref 3.5–5.0)
Alkaline Phosphatase: 92 U/L (ref 38–126)
Anion gap: 10 (ref 5–15)
BUN: 8 mg/dL (ref 6–20)
CO2: 25 mmol/L (ref 22–32)
Calcium: 8.8 mg/dL — ABNORMAL LOW (ref 8.9–10.3)
Chloride: 102 mmol/L (ref 98–111)
Creatinine, Ser: 0.79 mg/dL (ref 0.44–1.00)
GFR, Estimated: >60 mL/min (ref >60)
Glucose, Bld: 116 mg/dL — ABNORMAL HIGH (ref 70–99)
Potassium: 3.9 mmol/L (ref 3.5–5.1)
Sodium: 137 mmol/L (ref 135–145)
Total Bilirubin: 0.6 mg/dL (ref 0.0–1.2)
Total Protein: 6.8 g/dL (ref 6.5–8.1)

## 2024-01-11 LAB — CBC WITH DIFFERENTIAL/PLATELET
Abs Immature Granulocytes: 0.04 10*3/uL (ref 0.00–0.07)
Basophils Absolute: 0 10*3/uL (ref 0.0–0.1)
Basophils Relative: 0 %
Eosinophils Absolute: 0 10*3/uL (ref 0.0–0.5)
Eosinophils Relative: 0 %
HCT: 38.1 % (ref 36.0–46.0)
Hemoglobin: 12.6 g/dL (ref 12.0–15.0)
Immature Granulocytes: 0 %
Lymphocytes Relative: 6 %
Lymphs Abs: 0.6 10*3/uL — ABNORMAL LOW (ref 0.7–4.0)
MCH: 28.7 pg (ref 26.0–34.0)
MCHC: 33.1 g/dL (ref 30.0–36.0)
MCV: 86.8 fL (ref 80.0–100.0)
Monocytes Absolute: 0.4 10*3/uL (ref 0.1–1.0)
Monocytes Relative: 4 %
Neutro Abs: 8.9 10*3/uL — ABNORMAL HIGH (ref 1.7–7.7)
Neutrophils Relative %: 90 %
Platelets: 260 10*3/uL (ref 150–400)
RBC: 4.39 MIL/uL (ref 3.87–5.11)
RDW: 13.2 % (ref 11.5–15.5)
WBC: 10 10*3/uL (ref 4.0–10.5)
nRBC: 0 % (ref 0.0–0.2)

## 2024-01-11 LAB — URINALYSIS, ROUTINE W REFLEX MICROSCOPIC
Bacteria, UA: NONE SEEN
Bilirubin Urine: NEGATIVE
Glucose, UA: NEGATIVE mg/dL
Ketones, ur: NEGATIVE mg/dL
Leukocytes,Ua: NEGATIVE
Nitrite: NEGATIVE
Protein, ur: NEGATIVE mg/dL
Specific Gravity, Urine: 1.021 (ref 1.005–1.030)
pH: 6 (ref 5.0–8.0)

## 2024-01-11 LAB — PREGNANCY, URINE: Preg Test, Ur: NEGATIVE

## 2024-01-11 LAB — RESP PANEL BY RT-PCR (RSV, FLU A&B, COVID)  RVPGX2
Influenza A by PCR: NEGATIVE
Influenza B by PCR: NEGATIVE
Resp Syncytial Virus by PCR: NEGATIVE
SARS Coronavirus 2 by RT PCR: NEGATIVE

## 2024-01-11 MED ORDER — SODIUM CHLORIDE 0.9 % IV BOLUS
1000.0000 mL | Freq: Once | INTRAVENOUS | Status: AC
  Administered 2024-01-11: 1000 mL via INTRAVENOUS

## 2024-01-11 MED ORDER — AMOXICILLIN-POT CLAVULANATE 875-125 MG PO TABS: 1.0000 | ORAL_TABLET | Freq: Two times a day (BID) | ORAL | 0 refills | Status: DC

## 2024-01-11 NOTE — ED Notes (Signed)

## 2024-01-11 NOTE — ED Notes (Signed)
1 g tylenol at 2300

## 2024-01-11 NOTE — ED Notes (Signed)
LA and blood cultures collected

## 2024-01-11 NOTE — ED Provider Notes (Signed)
[place] [facility]PART[facility] AT [facility]  Provider Note  CSN: [id number] Arrival date & time: 01/11/24 0022  History Chief Complaint  Patient presents with   Diarrhea    Maria Clark is a 53 y.o. female reports 3 days of fever, chills, body aches and diarrhea. No respiratory symptoms. No abdominal pain or blood in stool. No nausea or vomiting.   She also reports her vaccinated cat bit her on her R hand today and it has gotten a little red since then.    Home Medications Prior to Admission medications   Medication Sig Start Date End Date Taking? Authorizing Provider  amoxicillin-clavulanate (AUGMENTIN) 875-125 MG tablet Take 1 tablet by mouth every 12 (twelve) hours. 01/11/24  Yes Pollyann Savoy, MD  albuterol (PROVENTIL) (2.5 MG/3ML) 0.083% nebulizer solution Take 3 mLs (2.5 mg total) by nebulization every 6 (six) hours as needed for wheezing or shortness of breath. 07/04/23   Crain, Whitney L, PA  albuterol (VENTOLIN HFA) 108 (90 Base) MCG/ACT inhaler Inhale 2 puffs into the lungs every 6 (six) hours as needed for wheezing or shortness of breath. 12/12/21   Tower, Audrie Gallus, MD  ALPRAZolam Prudy Feeler) 0.5 MG tablet Take 0.5 mg by mouth daily as needed. 11/12/22   [provider]  b complex vitamins capsule Take 1 capsule by mouth daily.    [provider]  buPROPion (WELLBUTRIN XL) 150 MG 24 hr tablet TAKE 2 TABLETS (300 MG TOTAL) BY MOUTH DAILY. 08/06/21   Reva Bores, MD  cariprazine (VRAYLAR) 1.5 MG capsule Take 1.5 mg by mouth daily.    [provider]  colesevelam (WELCHOL) 625 MG tablet Take 1,250 mg by mouth 2 (two) times daily with a meal.    [provider]  cyclobenzaprine (FLEXERIL) 10 MG tablet Take 1 tablet (10 mg total) by mouth 3 (three) times daily as needed for muscle spasms. 09/04/22   McElwee, Lauren A, NP  escitalopram (LEXAPRO) 20 MG tablet Take 1 tablet by mouth daily. 08/02/22   [provider]   fluticasone-salmeterol (WIXELA INHUB) 250-50 MCG/ACT AEPB Inhale 1 puff into the lungs in the morning and at bedtime. 12/23/23   McElwee, Lauren A, NP  hydrOXYzine (ATARAX) 50 MG tablet Take 50-100 mg by mouth at bedtime. 03/14/23   [provider]  ibuprofen (ADVIL) 200 MG tablet Take 400 mg by mouth every 6 (six) hours as needed for headache or moderate pain.    [provider]  ipratropium (ATROVENT) 0.03 % nasal spray PLACE 2 SPRAYS INTO BOTH NOSTRILS EVERY 12 (TWELVE) HOURS. Patient not taking: Reported on 12/23/2023 12/16/23   Gerre Scull, NP  lansoprazole (PREVACID) 30 MG capsule Take 1 capsule (30 mg total) by mouth 2 (two) times daily before a meal. 10/21/23   Imogene Burn, MD  montelukast (SINGULAIR) 10 MG tablet Take 1 tablet (10 mg total) by mouth at bedtime. Patient not taking: Reported on 08/13/2023 07/04/23   Guy Sandifer L, PA  naproxen (NAPROSYN) 500 MG tablet Take 1 tablet (500 mg total) by mouth 2 (two) times daily with a meal. Patient not taking: Reported on 12/23/2023 11/07/23   McElwee, Lauren A, NP  rOPINIRole (REQUIP) 1 MG tablet Take 1 mg by mouth at bedtime. 04/02/22   [provider]  Semaglutide-Weight Management (WEGOVY) 0.25 MG/0.5ML SOAJ Inject 0.25 mg into the skin once a week. 12/23/23   McElwee, Jake Church, NP  Solriamfetol HCl (SUNOSI) 150 MG TABS Take 150 mg  by mouth daily.    [provider]     Allergies    Dust mite extract   Review of Systems   Review of Systems Please see HPI for pertinent positives and negatives  Physical Exam BP 134/87   Pulse (!) 111   Temp (!) 100.6 F (38.1 C)   Resp 20   Wt 130.6 kg   LMP 10/26/2023   SpO2 96%   BMI 46.48 kg/m   Physical Exam Vitals and nursing note reviewed.  Constitutional:      Appearance: Normal appearance.  HENT:     Head: Normocephalic and atraumatic.     Nose: Nose normal.     Mouth/Throat:     Mouth: Mucous membranes are moist.  Eyes:     Extraocular  Movements: Extraocular movements intact.     Conjunctiva/sclera: Conjunctivae normal.  Cardiovascular:     Rate and Rhythm: Normal rate.  Pulmonary:     Effort: Pulmonary effort is normal.     Breath sounds: Normal breath sounds.  Abdominal:     General: Abdomen is flat.     Palpations: Abdomen is soft.     Tenderness: There is no abdominal tenderness. There is no guarding.  Musculoskeletal:        General: No swelling. Normal range of motion.     Cervical back: Neck supple.     Comments: Superficial puncture wounds with mild surrounding erythema on R thenar area.   Skin:    General: Skin is warm and dry.  Neurological:     General: No focal deficit present.     Mental Status: She is alert.  Psychiatric:        Mood and Affect: Mood normal.     ED Results / Procedures / Treatments   EKG None  Procedures Procedures  Medications Ordered in the ED Medications  sodium chloride 0.9 % bolus 1,000 mL (1,000 mLs Intravenous New Bag/Given 01/11/24 0138)    Initial Impression and Plan  Patient here with symptoms consistent with viral illness/diarrhea. Will check labs, give IVF. Anticipate she will need Abx for her cat bite as well.   ED Course   Clinical Course as of 01/11/24 0224  [name] Jan 11, 2024  0124 CBC is normal.  [CS]  0129 UA and HCG are neg.  [CS]  0200 CMP is unremarkable.  [CS]  0209 Covid/Flu/RSV swab is neg.  [CS]  0222 Patient feeling better after IVF. No concerning findings with labs here. No abdominal pain to suggest significant intraabdominal infection. Likely a viral gastroenteritis. Recommend OTC antidiarrheals, antipyretics and oral hydration. RTED for any other concerns.   Will plan discharge with Rx for Augmentin for cat bite.  [CS]    Clinical Course User Index [CS] Pollyann Savoy, MD     MDM Rules/Calculators/A&P Medical Decision Making Problems Addressed: Cat bite of right hand, initial encounter: acute illness or injury Viral  gastroenteritis: acute illness or injury  Amount and/or Complexity of Data Reviewed Labs: ordered. Decision-making details documented in ED Course.  Risk Prescription drug management.     Final Clinical Impression(s) / ED Diagnoses Final diagnoses:  Viral gastroenteritis  Cat bite of right hand, initial encounter    Rx / DC Orders ED Discharge Orders          Ordered    amoxicillin-clavulanate (AUGMENTIN) 875-125 MG tablet  Every 12 hours        01/11/24 0224  Pollyann Savoy, MD 01/11/24 [phone]

## 2024-01-11 NOTE — ED Triage Notes (Signed)
Pt endorses fever, body aches  with chills and diarrhea. Pt also bitten by her cat today on RT hand, cat utd on vaccinations.

## 2024-01-13 ENCOUNTER — Encounter: Payer: 59 | Admitting: Nurse Practitioner

## 2024-02-17 ENCOUNTER — Ambulatory Visit (INDEPENDENT_AMBULATORY_CARE_PROVIDER_SITE_OTHER): Payer: 59 | Admitting: Nurse Practitioner

## 2024-02-17 ENCOUNTER — Encounter: Payer: Self-pay | Admitting: Nurse Practitioner

## 2024-02-17 VITALS — BP 124/80 | HR 73 | Temp 96.8°F | Ht 66.0 in | Wt 290.8 lb

## 2024-02-17 DIAGNOSIS — G4733 Obstructive sleep apnea (adult) (pediatric): Secondary | ICD-10-CM

## 2024-02-17 DIAGNOSIS — J4530 Mild persistent asthma, uncomplicated: Secondary | ICD-10-CM

## 2024-02-17 DIAGNOSIS — Z0000 Encounter for general adult medical examination without abnormal findings: Secondary | ICD-10-CM | POA: Insufficient documentation

## 2024-02-17 DIAGNOSIS — I10 Essential (primary) hypertension: Secondary | ICD-10-CM

## 2024-02-17 DIAGNOSIS — Z23 Encounter for immunization: Secondary | ICD-10-CM

## 2024-02-17 DIAGNOSIS — R1310 Dysphagia, unspecified: Secondary | ICD-10-CM

## 2024-02-17 DIAGNOSIS — R0982 Postnasal drip: Secondary | ICD-10-CM

## 2024-02-17 DIAGNOSIS — F332 Major depressive disorder, recurrent severe without psychotic features: Secondary | ICD-10-CM

## 2024-02-17 DIAGNOSIS — G2581 Restless legs syndrome: Secondary | ICD-10-CM

## 2024-02-17 MED ORDER — PHENTERMINE HCL 30 MG PO CAPS: 30.0000 mg | ORAL_CAPSULE | ORAL | 0 refills | Status: DC

## 2024-02-17 NOTE — Assessment & Plan Note (Signed)
 Continue ipratropium nasal spray and start zyrtec or claritin daily.

## 2024-02-17 NOTE — Assessment & Plan Note (Signed)
Health maintenance reviewed and updated. Discussed nutrition, exercise. Follow-up 1 year.

## 2024-02-17 NOTE — Progress Notes (Signed)
 BP 124/80 (BP Location: Left Arm, Patient Position: Sitting, Cuff Size: Normal)   Pulse 73   Temp (!) 96.8 F (36 C)   Ht 5\' 6"  (1.676 m)   Wt 290 lb 12.8 oz (131.9 kg)   LMP 10/20/2023 (Approximate)   SpO2 97%   BMI 46.94 kg/m    Subjective:    Patient ID: Maria Clark, female    DOB: September 19, 1971, 53 y.o.   MRN: [id number]  CC: Chief Complaint  Patient presents with   Annual Exam    With lab work-patient is not fasting     HPI: Maria Clark is a 53 y.o. female presenting on 02/17/2024 for comprehensive medical examination. Current medical complaints include:none  She currently lives with: partner  Depression and Anxiety Screen done today and results listed below:     02/17/2024    9:19 AM 11/07/2023    9:02 AM 07/08/2023    9:54 AM 01/07/2023    5:05 PM 12/03/2022   10:46 AM  Depression screen PHQ 2/9  Decreased Interest 2 1 0 1 0  Down, Depressed, Hopeless 1 1 1 2 1   PHQ - 2 Score 3 2 1 3 1   Altered sleeping 0 0 0 0 3  Tired, decreased energy 3 3 1 3 1   Change in appetite 1 1 0 2 1  Feeling bad or failure about yourself  0 0 0 0 0  Trouble concentrating 0 1 0 0 1  Moving slowly or fidgety/restless 0 0 0 0 1  Suicidal thoughts 0 0 0 1 0  PHQ-9 Score 7 7 2 9 8   Difficult doing work/chores Somewhat difficult Somewhat difficult Not difficult at all Somewhat difficult Somewhat difficult      02/17/2024    9:20 AM 11/07/2023    9:03 AM 07/08/2023    9:54 AM 01/07/2023    5:05 PM  GAD 7 : Generalized Anxiety Score  Nervous, Anxious, on Edge 0 1 0 3  Control/stop worrying 1 1 0 2  Worry too much - different things 0 1 0 1  Trouble relaxing 1 1 1 2   Restless 0 0 1 0  Easily annoyed or irritable 1 1 0 0  Afraid - awful might happen 0 0 0 0  Total GAD 7 Score 3 5 2 8   Anxiety Difficulty Somewhat difficult Somewhat difficult Not difficult at all Somewhat difficult    The patient does not have a history of falls. I did not complete a risk  assessment for falls. A plan of care for falls was not documented.   Past Medical History:  Past Medical History:  Diagnosis Date   Anemia    Anxiety    Asthma    Complication of anesthesia    woozy out of it for more than a day previously   Depression    Endometriosis    Epstein Barr infection    Mental disorder    Depression   PCOS (polycystic ovarian syndrome)    Sleep apnea     Surgical History:  Past Surgical History:  Procedure Laterality Date   ANTERIOR CRUCIATE LIGAMENT REPAIR     right x 2, left x 1   CHOLECYSTECTOMY     LAPAROSCOPY     to determine endometriosis    Medications:  Current Outpatient Medications on File Prior to Visit  Medication Sig   albuterol (PROVENTIL) (2.5 MG/3ML) 0.083% nebulizer solution Take 3 mLs (2.5 mg total) by nebulization every 6 (six) hours  as needed for wheezing or shortness of breath.   albuterol (VENTOLIN HFA) 108 (90 Base) MCG/ACT inhaler Inhale 2 puffs into the lungs every 6 (six) hours as needed for wheezing or shortness of breath.   ALPRAZolam (XANAX) 0.5 MG tablet Take 0.5 mg by mouth daily as needed.   b complex vitamins capsule Take 1 capsule by mouth daily.   buPROPion (WELLBUTRIN XL) 150 MG 24 hr tablet TAKE 2 TABLETS (300 MG TOTAL) BY MOUTH DAILY.   cariprazine (VRAYLAR) 1.5 MG capsule Take 1.5 mg by mouth daily.   colesevelam (WELCHOL) 625 MG tablet Take 1,250 mg by mouth 2 (two) times daily with a meal.   cyclobenzaprine (FLEXERIL) 10 MG tablet Take 1 tablet (10 mg total) by mouth 3 (three) times daily as needed for muscle spasms.   escitalopram (LEXAPRO) 20 MG tablet Take 1 tablet by mouth daily.   fluticasone-salmeterol (WIXELA INHUB) 250-50 MCG/ACT AEPB Inhale 1 puff into the lungs in the morning and at bedtime.   hydrOXYzine (ATARAX) 50 MG tablet Take 50-100 mg by mouth at bedtime.   ibuprofen (ADVIL) 200 MG tablet Take 400 mg by mouth every 6 (six) hours as needed for headache or moderate pain.   ipratropium  (ATROVENT) 0.03 % nasal spray PLACE 2 SPRAYS INTO BOTH NOSTRILS EVERY 12 (TWELVE) HOURS.   lansoprazole (PREVACID) 30 MG capsule Take 1 capsule (30 mg total) by mouth 2 (two) times daily before a meal.   rOPINIRole (REQUIP) 1 MG tablet Take 1 mg by mouth at bedtime.   Solriamfetol HCl (SUNOSI) 150 MG TABS Take 150 mg by mouth daily.   No current facility-administered medications on file prior to visit.    Allergies:  Allergies  Allergen Reactions   Dust Mite Extract Other (See Comments) and Swelling    Client reports "dust makes my asthma worse" it can increase wheezing..  Other Reaction(s): Bronchospasm    Social History:  Social History   Socioeconomic History   Marital status: Divorced    Spouse name: Jae Dire   Number of children: 0   Years of education: Master's Degree   Highest education level: Master's degree (e.g., MA, MS, MEng, MEd, MSW, MBA)  Occupational History   Not on file  Tobacco Use   Smoking status: Never    Passive exposure: Never   Smokeless tobacco: Never  Vaping Use   Vaping status: Never Used  Substance and Sexual Activity   Alcohol use: Yes    Comment: rarely   Drug use: Never   Sexual activity: Not on file    Comment: female partners  Other Topics Concern   Not on file  Social History Narrative      Social Drivers of Health   Financial Resource Strain: Low Risk  (12/19/2023)   Overall Financial Resource Strain (CARDIA)    Difficulty of Paying Living Expenses: Not hard at all  Food Insecurity: No Food Insecurity (12/19/2023)   Hunger Vital Sign    Worried About Running Out of Food in the Last Year: Never true    Ran Out of Food in the Last Year: Never true  Transportation Needs: No Transportation Needs (12/19/2023)   PRAPARE - Administrator, Civil Service (Medical): No    Lack of Transportation (Non-Medical): No  Physical Activity: Inactive (12/19/2023)   Exercise Vital Sign    Days of Exercise per Week: 0 days    Minutes of  Exercise per Session: 10 min  Stress: Stress Concern Present (12/19/2023)  [employer] of Occupational Health - Occupational Stress Questionnaire    Feeling of Stress : To some extent  Social Connections: Moderately Integrated (12/19/2023)   Social Connection and Isolation Panel [NHANES]    Frequency of Communication with Friends and Family: Twice a week    Frequency of Social Gatherings with Friends and Family: Once a week    Attends Religious Services: Never    Database administrator or Organizations: Yes    Attends Engineer, structural: More than 4 times per year    Marital Status: Living with partner  Recent Concern: Social Connections - Socially Isolated (11/04/2023)   Social Connection and Isolation Panel [NHANES]    Frequency of Communication with Friends and Family: Once a week    Frequency of Social Gatherings with Friends and Family: Once a week    Attends Religious Services: Never    Database administrator or Organizations: Yes    Attends Engineer, structural: More than 4 times per year    Marital Status: Divorced  Intimate Partner Violence: Unknown (07/28/2023)   Received from [facility]   HITS    Physically Hurt: Not on file    Insult or Talk Down To: Not on file    Threaten Physical Harm: Not on file    Scream or Curse: Not on file   Social History   Tobacco Use  Smoking Status Never   Passive exposure: Never  Smokeless Tobacco Never   Social History   Substance and Sexual Activity  Alcohol Use Yes   Comment: rarely    Family History:  Family History  Problem Relation Age of Onset   Hypertension Mother    Arthritis Mother    Benign prostatic hyperplasia Father    Aneurysm Paternal Aunt        cardiac - non-smoker   Aneurysm Paternal Grandfather        stomach - smoker   Polycystic ovary syndrome Neg Hx    Colon cancer Neg Hx    Esophageal cancer Neg Hx    Rectal cancer Neg Hx    Stomach cancer Neg Hx     Past medical  history, surgical history, medications, allergies, family history and social history reviewed with patient today and changes made to appropriate areas of the chart.   Review of Systems  Constitutional: Negative.   HENT: Negative.    Eyes: Negative.   Respiratory: Negative.    Cardiovascular: Negative.   Gastrointestinal: Negative.   Genitourinary: Negative.   Musculoskeletal: Negative.   Skin: Negative.   Neurological: Negative.   Psychiatric/Behavioral: Negative.     All other ROS negative except what is listed above and in the HPI.      Objective:    BP 124/80 (BP Location: Left Arm, Patient Position: Sitting, Cuff Size: Normal)   Pulse 73   Temp (!) 96.8 F (36 C)   Ht 5\' 6"  (1.676 m)   Wt 290 lb 12.8 oz (131.9 kg)   LMP 10/20/2023 (Approximate)   SpO2 97%   BMI 46.94 kg/m   Wt Readings from Last 3 Encounters:  02/17/24 290 lb 12.8 oz (131.9 kg)  01/11/24 288 lb (130.6 kg)  12/23/23 288 lb (130.6 kg)    Physical Exam Vitals and nursing note reviewed.  Constitutional:      General: She is not in acute distress.    Appearance: Normal appearance. She is obese.  HENT:     Head: Normocephalic and atraumatic.  Right Ear: Tympanic membrane, ear canal and external ear normal.     Left Ear: Tympanic membrane, ear canal and external ear normal.  Eyes:     Conjunctiva/sclera: Conjunctivae normal.  Cardiovascular:     Rate and Rhythm: Normal rate and regular rhythm.     Pulses: Normal pulses.     Heart sounds: Normal heart sounds.  Pulmonary:     Effort: Pulmonary effort is normal.     Breath sounds: Normal breath sounds.  Abdominal:     Palpations: Abdomen is soft.     Tenderness: There is no abdominal tenderness.  Musculoskeletal:        General: Normal range of motion.     Cervical back: Normal range of motion and neck supple.     Right lower leg: No edema.     Left lower leg: No edema.  Lymphadenopathy:     Cervical: No cervical adenopathy.  Skin:     General: Skin is warm and dry.  Neurological:     General: No focal deficit present.     Mental Status: She is alert and oriented to person, place, and time.     Cranial Nerves: No cranial nerve deficit.     Coordination: Coordination normal.     Gait: Gait normal.  Psychiatric:        Mood and Affect: Mood normal.        Behavior: Behavior normal.        Thought Content: Thought content normal.        Judgment: Judgment normal.     Results for orders placed or performed during the hospital encounter of 01/11/24  Resp panel by RT-PCR (RSV, Flu A&B, Covid) Anterior Nasal Swab   Collection Time: 01/11/24 12:47 AM   Specimen: Anterior Nasal Swab  Result Value Ref Range   SARS Coronavirus 2 by RT PCR NEGATIVE NEGATIVE   Influenza A by PCR NEGATIVE NEGATIVE   Influenza B by PCR NEGATIVE NEGATIVE   Resp Syncytial Virus by PCR NEGATIVE NEGATIVE  Comprehensive metabolic panel   Collection Time: 01/11/24 12:47 AM  Result Value Ref Range   Sodium 137 135 - 145 mmol/L   Potassium 3.9 3.5 - 5.1 mmol/L   Chloride 102 98 - 111 mmol/L   CO2 25 22 - 32 mmol/L   Glucose, Bld 116 (H) 70 - 99 mg/dL   BUN 8 6 - 20 mg/dL   Creatinine, Ser 1.19 0.44 - 1.00 mg/dL   Calcium 8.8 (L) 8.9 - 10.3 mg/dL   Total Protein 6.8 6.5 - 8.1 g/dL   Albumin 4.1 3.5 - 5.0 g/dL   AST 20 15 - 41 U/L   ALT 41 0 - 44 U/L   Alkaline Phosphatase 92 38 - 126 U/L   Total Bilirubin 0.6 0.0 - 1.2 mg/dL   GFR, Estimated >14 >78 mL/min   Anion gap 10 5 - 15  CBC with Differential   Collection Time: 01/11/24 12:47 AM  Result Value Ref Range   WBC 10.0 4.0 - 10.5 K/uL   RBC 4.39 3.87 - 5.11 MIL/uL   Hemoglobin 12.6 12.0 - 15.0 g/dL   HCT 29.5 62.1 - 30.8 %   MCV 86.8 80.0 - 100.0 fL   MCH 28.7 26.0 - 34.0 pg   MCHC 33.1 30.0 - 36.0 g/dL   RDW 65.7 84.6 - 96.2 %   Platelets 260 150 - 400 K/uL   nRBC 0.0 0.0 - 0.2 %   Neutrophils Relative % 90 %  Neutro Abs 8.9 (H) 1.7 - 7.7 K/uL   Lymphocytes Relative 6 %    Lymphs Abs 0.6 (L) 0.7 - 4.0 K/uL   Monocytes Relative 4 %   Monocytes Absolute 0.4 0.1 - 1.0 K/uL   Eosinophils Relative 0 %   Eosinophils Absolute 0.0 0.0 - 0.5 K/uL   Basophils Relative 0 %   Basophils Absolute 0.0 0.0 - 0.1 K/uL   Immature Granulocytes 0 %   Abs Immature Granulocytes 0.04 0.00 - 0.07 K/uL  Urinalysis, Routine w reflex microscopic -Urine, Clean Catch   Collection Time: 01/11/24 12:47 AM  Result Value Ref Range   Color, Urine YELLOW YELLOW   APPearance CLEAR CLEAR   Specific Gravity, Urine 1.021 1.005 - 1.030   pH 6.0 5.0 - 8.0   Glucose, UA NEGATIVE NEGATIVE mg/dL   Hgb urine dipstick MODERATE (A) NEGATIVE   Bilirubin Urine NEGATIVE NEGATIVE   Ketones, ur NEGATIVE NEGATIVE mg/dL   Protein, ur NEGATIVE NEGATIVE mg/dL   Nitrite NEGATIVE NEGATIVE   Leukocytes,Ua NEGATIVE NEGATIVE   RBC / HPF 0-5 0 - 5 RBC/hpf   WBC, UA 0-5 0 - 5 WBC/hpf   Bacteria, UA NONE SEEN NONE SEEN   Squamous Epithelial / HPF 0-5 0 - 5 /HPF   Mucus PRESENT   Pregnancy, urine   Collection Time: 01/11/24 12:47 AM  Result Value Ref Range   Preg Test, Ur NEGATIVE NEGATIVE      Assessment & Plan:   Problem List Items Addressed This Visit       Cardiovascular and Mediastinum   Essential hypertension   Chronic, stable. Not currently taking any medications for this, well controlled with diet. Recent BMP and CBC reviewed.         Respiratory   Asthma   Chronic, stable. Continue wixela inhaler BID.       OSA treated with BiPAP   Chronic, stable. Continue using bipap at night.         Digestive   Dysphagia   She recently got her esophagus stretched and has had improvement with dysphagia.         Other   Restless legs   Chronic, stable. Continue requip 1mg  daily at bedtime.       Morbid obesity (HCC)   BMI 46.9. She has been focusing on smaller meals, incorporating vegetables and fruits. She has also tried to increase her walking. Her insurance does not cover weight loss  medications. Will have her start phentermine 30mg  daily to help with weight loss. Discussed possible side effects. Continue focus on nutrition and exercise. PDMP reviewed. Follow-up in 4 weeks.       Relevant Medications   phentermine 30 MG capsule   MDD (major depressive disorder), recurrent severe, without psychosis (HCC)   Chronic, stable. Continue following with therapist and psychiatrist.       Post-nasal drip   Continue ipratropium nasal spray and start zyrtec or claritin daily.       Routine general medical examination at a health care facility - Primary   Health maintenance reviewed and updated. Discussed nutrition, exercise. Follow-up 1 year.        Other Visit Diagnoses       Immunization due       Prevnar 20 given today.   Relevant Orders   Pneumococcal conjugate vaccine 20-valent (Completed)        Follow up plan: Return in about 4 weeks (around 03/16/2024) for 4-6 weeks weight management.   LABORATORY TESTING:  -  Pap smear: up to date  IMMUNIZATIONS:   - Tdap: Tetanus vaccination status reviewed: last tetanus booster within 10 years. - Influenza: Declined - Prevnar: Administered today - HPV: Not applicable - Shingrix vaccine: Declined  SCREENING: -Mammogram: Up to date  - Colonoscopy: Up to date  - Bone Density: Not applicable   PATIENT COUNSELING:   Advised to take 1 mg of folate supplement per day if capable of pregnancy.   Sexuality: Discussed sexually transmitted diseases, partner selection, use of condoms, avoidance of unintended pregnancy  and contraceptive alternatives.   Advised to avoid cigarette smoking.  I discussed with the patient that most people either abstain from alcohol or drink within safe limits (<=14/week and <=4 drinks/occasion for males, <=7/weeks and <= 3 drinks/occasion for females) and that the risk for alcohol disorders and other health effects rises proportionally with the number of drinks per week and how often a drinker  exceeds daily limits.  Discussed cessation/primary prevention of drug use and availability of treatment for abuse.   Diet: Encouraged to adjust caloric intake to maintain  or achieve ideal body weight, to reduce intake of dietary saturated fat and total fat, to limit sodium intake by avoiding high sodium foods and not adding table salt, and to maintain adequate dietary potassium and calcium preferably from fresh fruits, vegetables, and low-fat dairy products.    stressed the importance of regular exercise  Injury prevention: Discussed safety belts, safety helmets, smoke detector, smoking near bedding or upholstery.   Dental health: Discussed importance of regular tooth brushing, flossing, and dental visits.    NEXT PREVENTATIVE PHYSICAL DUE IN 1 YEAR. Return in about 4 weeks (around 03/16/2024) for 4-6 weeks weight management.  Jhair Witherington A Kenly Henckel

## 2024-02-17 NOTE — Assessment & Plan Note (Signed)
 Chronic, stable. Continue using bipap at night.

## 2024-02-17 NOTE — Assessment & Plan Note (Signed)
 Chronic, stable. Continue wixela inhaler BID.

## 2024-02-17 NOTE — Assessment & Plan Note (Signed)
 Chronic, stable. Not currently taking any medications for this, well controlled with diet. Recent BMP and CBC reviewed.

## 2024-02-17 NOTE — Patient Instructions (Signed)
 It was great to see you!  Start phentermine 1 capsule daily. You can use good rx to get a discount  Continue focusing on nutrition and exercise  Start claritin, zyrtec, or xyzal daily for post nasal drip/hoarseness  Let's follow-up in 4-6 weeks, sooner if you have concerns.  If a referral was placed today, you will be contacted for an appointment. Please note that routine referrals can sometimes take up to 3-4 weeks to process. Please call our office if you haven't heard anything after this time frame.  Take care,  Rodman Pickle, NP

## 2024-02-17 NOTE — Assessment & Plan Note (Signed)
Chronic, stable. Continue requip '1mg'$  daily at bedtime.

## 2024-02-17 NOTE — Assessment & Plan Note (Signed)
 She recently got her esophagus stretched and has had improvement with dysphagia.

## 2024-02-17 NOTE — Assessment & Plan Note (Signed)
 BMI 46.9. She has been focusing on smaller meals, incorporating vegetables and fruits. She has also tried to increase her walking. Her insurance does not cover weight loss medications. Will have her start phentermine 30mg  daily to help with weight loss. Discussed possible side effects. Continue focus on nutrition and exercise. PDMP reviewed. Follow-up in 4 weeks.

## 2024-02-17 NOTE — Assessment & Plan Note (Signed)
Chronic, stable. Continue following with therapist and psychiatrist.

## 2024-02-20 ENCOUNTER — Other Ambulatory Visit: Payer: Self-pay | Admitting: Nurse Practitioner

## 2024-02-20 NOTE — Telephone Encounter (Signed)
 RequestingMonte Fantasia 250-50 INHUB  Last Visit: 02/17/2024 Next Visit: 03/26/2024 Last Refill: 12/23/2023  Please Advise

## 2024-03-01 ENCOUNTER — Encounter: Payer: Self-pay | Admitting: Nurse Practitioner

## 2024-03-15 ENCOUNTER — Other Ambulatory Visit: Payer: Self-pay | Admitting: Nurse Practitioner

## 2024-03-15 MED ORDER — PHENTERMINE HCL 30 MG PO CAPS: 30.0000 mg | ORAL_CAPSULE | ORAL | 0 refills | Status: DC

## 2024-03-15 NOTE — Telephone Encounter (Signed)
 Requesting: phentermine 30 MG capsule  Last Visit: 02/17/2024 Next Visit: 03/26/2024 Last Refill: 02/17/2024  Please Advise

## 2024-03-26 ENCOUNTER — Encounter: Payer: Self-pay | Admitting: Nurse Practitioner

## 2024-03-26 ENCOUNTER — Ambulatory Visit (INDEPENDENT_AMBULATORY_CARE_PROVIDER_SITE_OTHER): Admitting: Nurse Practitioner

## 2024-03-26 VITALS — BP 116/82 | HR 87 | Temp 97.1°F | Ht 66.0 in | Wt 284.8 lb

## 2024-03-26 DIAGNOSIS — R5382 Chronic fatigue, unspecified: Secondary | ICD-10-CM | POA: Diagnosis not present

## 2024-03-26 NOTE — Progress Notes (Signed)
 Established Patient Office Visit  Subjective   Patient ID: Maria Clark, female    DOB: [birth year]  Age: 53 y.o. MRN: [id number]  Chief Complaint  Patient presents with   Weight Management    Follow up, discuss medication and other choices    HPI  Discussed the use of AI scribe software for clinical note transcription with the patient, who gave verbal consent to proceed.  History of Present Illness   The patient, with a history of obesity, has been on Phentermine for six weeks and reports minimal weight loss. She has started exercising, joining a disc golf league, and has been trying to consume less sugar. She reports a slight decrease in appetite since starting Phentermine. She has also been dealing with chronic fatigue, despite her Malachi Carl being in check. She sleeps for nine hours a night and wears a mask for her mild sleep apnea. She also reports getting nauseous and shaky when playing disc golf which causes her not to be able to drink and then the nausea gets worse. She denies vomiting, chest pain, shortness of breath, and syncope.      ROS See pertinent positives and negatives per HPI.    Objective:     BP 116/82 (BP Location: Right Arm, Patient Position: Sitting, Cuff Size: Large)   Pulse 87   Temp (!) 97.1 F (36.2 C)   Ht 5\' 6"  (1.676 m)   Wt 284 lb 12.8 oz (129.2 kg)   SpO2 97%   BMI 45.97 kg/m  BP Readings from Last 3 Encounters:  03/26/24 116/82  02/17/24 124/80  01/11/24 135/61   Wt Readings from Last 3 Encounters:  03/26/24 284 lb 12.8 oz (129.2 kg)  02/17/24 290 lb 12.8 oz (131.9 kg)  01/11/24 288 lb (130.6 kg)      Physical Exam Vitals and nursing note reviewed.  Constitutional:      General: She is not in acute distress.    Appearance: Normal appearance.  HENT:     Head: Normocephalic.  Eyes:     Conjunctiva/sclera: Conjunctivae normal.  Cardiovascular:     Rate and Rhythm: Normal rate and regular rhythm.     Pulses: Normal  pulses.     Heart sounds: Normal heart sounds.  Pulmonary:     Effort: Pulmonary effort is normal.     Breath sounds: Normal breath sounds.  Abdominal:     Palpations: Abdomen is soft.     Tenderness: There is no abdominal tenderness.  Musculoskeletal:     Cervical back: Normal range of motion.  Skin:    General: Skin is warm.  Neurological:     General: No focal deficit present.     Mental Status: She is alert and oriented to person, place, and time.  Psychiatric:        Mood and Affect: Mood normal.        Behavior: Behavior normal.        Thought Content: Thought content normal.        Judgment: Judgment normal.    The 10-year ASCVD risk score (Arnett DK, et al., 2019) is: 1.1%    Assessment & Plan:   Problem List Items Addressed This Visit       Other   Morbid obesity (HCC)   BMI 45.9. She lost six pounds in six weeks on phentermine, meeting weight loss goals without side effects. Insurance limits other medication options. Continue phentermine 30mg  daily for three months, then take a three-month break.  Encourage exercise and dietary modifications. Advise inquiring about insurance coverage for Zepbound for sleep apnea. Encouraged her to drink plenty of water throughout the day as phentermine can cause dehydration and the nausea with exercise.       Chronic fatigue - Primary   She reports fatigue despite adequate sleep and CPAP use. Epstein-Barr is controlled, and labs are normal. Possible nervous system overload or perimenopausal changes. Phentermine is not effective for fatigue. Encourage exercise to help with fatigue.        Return in about 5 months (around 08/26/2024) for weight management .    Gerre Scull, NP

## 2024-03-26 NOTE — Assessment & Plan Note (Signed)
 She reports fatigue despite adequate sleep and CPAP use. Epstein-Barr is controlled, and labs are normal. Possible nervous system overload or perimenopausal changes. Phentermine is not effective for fatigue. Encourage exercise to help with fatigue.

## 2024-03-26 NOTE — Assessment & Plan Note (Signed)
 BMI 45.9. She lost six pounds in six weeks on phentermine, meeting weight loss goals without side effects. Insurance limits other medication options. Continue phentermine 30mg  daily for three months, then take a three-month break. Encourage exercise and dietary modifications. Advise inquiring about insurance coverage for Zepbound for sleep apnea. Encouraged her to drink plenty of water throughout the day as phentermine can cause dehydration and the nausea with exercise.

## 2024-03-26 NOTE — Patient Instructions (Addendum)
 It was great to see you!  You can ask your insurance if they cover zepbound for sleep apnea  Let's follow-up in 5 months, sooner if you have concerns.  If a referral was placed today, you will be contacted for an appointment. Please note that routine referrals can sometimes take up to 3-4 weeks to process. Please call our office if you haven't heard anything after this time frame.  Take care,  Rodman Pickle, NP

## 2024-03-30 ENCOUNTER — Ambulatory Visit: Admitting: Nurse Practitioner

## 2024-03-31 ENCOUNTER — Encounter: Payer: Self-pay | Admitting: Nurse Practitioner

## 2024-03-31 DIAGNOSIS — G4733 Obstructive sleep apnea (adult) (pediatric): Secondary | ICD-10-CM

## 2024-03-31 MED ORDER — ZEPBOUND 2.5 MG/0.5ML ~~LOC~~ SOAJ: 2.5000 mg | SUBCUTANEOUS | 1 refills | Status: DC

## 2024-04-02 ENCOUNTER — Other Ambulatory Visit (HOSPITAL_COMMUNITY): Payer: Self-pay

## 2024-04-02 ENCOUNTER — Telehealth: Payer: Self-pay | Admitting: Pharmacy Technician

## 2024-04-02 NOTE — Telephone Encounter (Signed)
 PA request has been Submitted. New Encounter has been or will be created for follow up. For additional info see Pharmacy Prior Auth telephone encounter from 04/02/24.

## 2024-04-02 NOTE — Telephone Encounter (Signed)
 Pharmacy Patient Advocate Encounter   Received notification from Pt Calls Messages that prior authorization for ZEPBOUND  2.5 MG is required/requested.   Insurance verification completed.   The patient is insured through [facility] .   Per test claim: PA required; PA submitted to above mentioned insurance via CoverMyMeds Key/confirmation #/EOC [id number] Status is pending

## 2024-04-02 NOTE — Telephone Encounter (Signed)
 Pharmacy Patient Advocate Encounter  Received notification from [facility] that Prior Authorization for ZEPBOUND  2.5MG /0.5ML has been DENIED.  Full denial letter will be uploaded to the media tab. See denial reason below.   PA #/Case ID/Reference #: [id number]

## 2024-04-05 ENCOUNTER — Other Ambulatory Visit (HOSPITAL_COMMUNITY): Payer: Self-pay

## 2024-04-05 NOTE — Telephone Encounter (Signed)
 Left message for patient to return call.

## 2024-04-06 NOTE — Telephone Encounter (Signed)
 Left message for patient to return call.

## 2024-04-09 ENCOUNTER — Other Ambulatory Visit: Payer: Self-pay | Admitting: Nurse Practitioner

## 2024-04-09 MED ORDER — PHENTERMINE HCL 30 MG PO CAPS
30.0000 mg | ORAL_CAPSULE | ORAL | 0 refills | Status: AC
Start: 2024-04-09 — End: ?

## 2024-04-09 NOTE — Telephone Encounter (Signed)
 Requesting: phentermine  30 MG capsule  Last Visit: 03/26/2024 Next Visit: Visit date not found Last Refill: 03/15/2024  Please Advise

## 2024-04-19 ENCOUNTER — Other Ambulatory Visit: Payer: Self-pay | Admitting: Nurse Practitioner

## 2024-04-19 NOTE — Telephone Encounter (Signed)
 Requesting: WIXELA 250-50 INHUB  Last Visit: 03/26/2024 Next Visit: Visit date not found Last Refill: 02/20/2024  Please Advise

## 2024-05-19 ENCOUNTER — Encounter: Payer: Self-pay | Admitting: Nurse Practitioner

## 2024-05-19 ENCOUNTER — Telehealth: Admitting: Physician Assistant

## 2024-05-19 DIAGNOSIS — U071 COVID-19: Secondary | ICD-10-CM | POA: Diagnosis not present

## 2024-05-19 MED ORDER — NIRMATRELVIR/RITONAVIR (PAXLOVID)TABLET: 3.0000 | ORAL_TABLET | Freq: Two times a day (BID) | ORAL | 0 refills | Status: AC

## 2024-05-19 NOTE — Patient Instructions (Signed)
 Maria Clark, thank you for joining Angelia Kelp, PA-C for today's virtual visit.  While this provider is not your primary care provider (PCP), if your PCP is located in our provider database this encounter information will be shared with them immediately following your visit.   A [facility] MyChart account gives you access to today's visit and all your visits, tests, and labs performed at [facility] " click here if you don't have a [facility] MyChart account or go to [email or web address]  Consent: (Patient) Maria Linea Droke provided verbal consent for this virtual visit at the beginning of the encounter.  Current Medications:  Current Outpatient Medications:    nirmatrelvir/ritonavir (PAXLOVID) 20 x 150 MG & 10 x 100MG  TABS, Take 3 tablets by mouth 2 (two) times daily for 5 days. (Take nirmatrelvir 150 mg two tablets twice daily for 5 days and ritonavir 100 mg one tablet twice daily for 5 days) Patient GFR is greater than 60, Disp: 30 tablet, Rfl: 0   albuterol  (PROVENTIL ) (2.5 MG/3ML) 0.083% nebulizer solution, Take 3 mLs (2.5 mg total) by nebulization every 6 (six) hours as needed for wheezing or shortness of breath., Disp: 75 mL, Rfl: 0   albuterol  (VENTOLIN  HFA) 108 (90 Base) MCG/ACT inhaler, Inhale 2 puffs into the lungs every 6 (six) hours as needed for wheezing or shortness of breath., Disp: 1 each, Rfl: 1   ALPRAZolam (XANAX) 0.5 MG tablet, Take 0.5 mg by mouth daily as needed., Disp: , Rfl:    b complex vitamins capsule, Take 1 capsule by mouth daily., Disp: , Rfl:    buPROPion  (WELLBUTRIN  XL) 150 MG 24 hr tablet, TAKE 2 TABLETS (300 MG TOTAL) BY MOUTH DAILY., Disp: 180 tablet, Rfl: 3   cariprazine (VRAYLAR) 1.5 MG capsule, Take 1.5 mg by mouth daily., Disp: , Rfl:    colesevelam (WELCHOL) 625 MG tablet, Take 1,250 mg by mouth 2 (two) times daily with a meal., Disp: , Rfl:    cyclobenzaprine  (FLEXERIL ) 10 MG tablet, Take 1 tablet (10 mg  total) by mouth 3 (three) times daily as needed for muscle spasms., Disp: 30 tablet, Rfl: 2   escitalopram (LEXAPRO) 20 MG tablet, Take 1 tablet by mouth daily., Disp: , Rfl:    hydrOXYzine (ATARAX) 50 MG tablet, Take 50-100 mg by mouth at bedtime., Disp: , Rfl:    ibuprofen (ADVIL) 200 MG tablet, Take 400 mg by mouth every 6 (six) hours as needed for headache or moderate pain., Disp: , Rfl:    ipratropium (ATROVENT ) 0.03 % nasal spray, PLACE 2 SPRAYS INTO BOTH NOSTRILS EVERY 12 (TWELVE) HOURS., Disp: 30 mL, Rfl: 4   lansoprazole  (PREVACID ) 30 MG capsule, Take 1 capsule (30 mg total) by mouth 2 (two) times daily before a meal., Disp: 120 capsule, Rfl: 2   phentermine  30 MG capsule, Take 1 capsule (30 mg total) by mouth every morning., Disp: 30 capsule, Rfl: 0   rOPINIRole  (REQUIP ) 1 MG tablet, Take 1 mg by mouth at bedtime., Disp: , Rfl:    Solriamfetol HCl (SUNOSI) 150 MG TABS, Take 150 mg by mouth daily., Disp: , Rfl:    tirzepatide  (ZEPBOUND ) 2.5 MG/0.5ML Pen, Inject 2.5 mg into the skin once a week., Disp: 2 mL, Rfl: 1   WIXELA INHUB 250-50 MCG/ACT AEPB, INHALE 1 PUFF INTO THE LUNGS IN THE MORNING AND AT BEDTIME., Disp: 60 each, Rfl: 1   Medications ordered in this encounter:  Meds ordered this encounter  Medications   nirmatrelvir/ritonavir (PAXLOVID)  20 x 150 MG & 10 x 100MG  TABS    Sig: Take 3 tablets by mouth 2 (two) times daily for 5 days. (Take nirmatrelvir 150 mg two tablets twice daily for 5 days and ritonavir 100 mg one tablet twice daily for 5 days) Patient GFR is greater than 60    Dispense:  30 tablet    Refill:  0    Supervising Provider:   Corine Dice [phone]     *If you need refills on other medications prior to your next appointment, please contact your pharmacy*  Follow-Up: Call back or seek an in-person evaluation if the symptoms worsen or if the condition fails to improve as anticipated.  [facility] [phone]  Care Instructions: Can take  to lessen severity: Vit C 500mg  twice daily Quercertin 250-500mg  twice daily Zinc 75-100mg  daily Melatonin 3-6 mg at bedtime Vit D3 1000-2000 IU daily Aspirin 81 mg daily with food Optional: Famotidine  20mg  daily Also can add tylenol /ibuprofen as needed for fevers and body aches May add Mucinex  or Mucinex  DM as needed for cough/congestion    Isolation Instructions: You are to isolate at home until you have been fever free for at least 24 hours without a fever-reducing medication, and symptoms have been steadily improving for 24 hours. At that time,  you can end isolation but need to mask for an additional 5 days.   If you must be around other household members who do not have symptoms, you need to make sure that both you and the family members are masking consistently with a high-quality mask.  If you note any worsening of symptoms despite treatment, please seek an in-person evaluation ASAP. If you note any significant shortness of breath or any chest pain, please seek ER evaluation. Please do not delay care!   COVID-19: What to Do if You Are Sick If you test positive and are an older adult or someone who is at high risk of getting very sick from COVID-19, treatment may be available. Contact a healthcare provider right away after a positive test to determine if you are eligible, even if your symptoms are mild right now. You can also visit a Test to Treat location and, if eligible, receive a prescription from a provider. Don't delay: Treatment must be started within the first few days to be effective. If you have a fever, cough, or other symptoms, you might have COVID-19. Most people have mild illness and are able to recover at home. If you are sick: Keep track of your symptoms. If you have an emergency warning sign (including trouble breathing), call 911. Steps to help prevent the spread of COVID-19 if you are sick If you are sick with COVID-19 or think you might have COVID-19, follow the  steps below to care for yourself and to help protect other people in your home and community. Stay home except to get medical care Stay home. Most people with COVID-19 have mild illness and can recover at home without medical care. Do not leave your home, except to get medical care. Do not visit public areas and do not go to places where you are unable to wear a mask. Take care of yourself. Get rest and stay hydrated. Take over-the-counter medicines, such as acetaminophen , to help you feel better. Stay in touch with your doctor. Call before you get medical care. Be sure to get care if you have trouble breathing, or have any other emergency warning signs, or if you think it  is an emergency. Avoid public transportation, ride-sharing, or taxis if possible. Get tested If you have symptoms of COVID-19, get tested. While waiting for test results, stay away from others, including staying apart from those living in your household. Get tested as soon as possible after your symptoms start. Treatments may be available for people with COVID-19 who are at risk for becoming very sick. Don't delay: Treatment must be started early to be effective--some treatments must begin within 5 days of your first symptoms. Contact your healthcare provider right away if your test result is positive to determine if you are eligible. Self-tests are one of several options for testing for the virus that causes COVID-19 and may be more convenient than laboratory-based tests and point-of-care tests. Ask your healthcare provider or your local health department if you need help interpreting your test results. You can visit your state, tribal, local, and territorial health department's website to look for the latest local information on testing sites. Separate yourself from other people As much as possible, stay in a specific room and away from other people and pets in your home. If possible, you should use a separate bathroom. If you need  to be around other people or animals in or outside of the home, wear a well-fitting mask. Tell your close contacts that they may have been exposed to COVID-19. An infected person can spread COVID-19 starting 48 hours (or 2 days) before the person has any symptoms or tests positive. By letting your close contacts know they may have been exposed to COVID-19, you are helping to protect everyone. See COVID-19 and Animals if you have questions about pets. If you are diagnosed with COVID-19, someone from the health department may call you. Answer the call to slow the spread. Monitor your symptoms Symptoms of COVID-19 include fever, cough, or other symptoms. Follow care instructions from your healthcare provider and local health department. Your local health authorities may give instructions on checking your symptoms and reporting information. When to seek emergency medical attention Look for emergency warning signs* for COVID-19. If someone is showing any of these signs, seek emergency medical care immediately: Trouble breathing Persistent pain or pressure in the chest New confusion Inability to wake or stay awake Pale, gray, or blue-colored skin, lips, or nail beds, depending on skin tone *This list is not all possible symptoms. Please call your medical provider for any other symptoms that are severe or concerning to you. Call 911 or call ahead to your local emergency facility: Notify the operator that you are seeking care for someone who has or may have COVID-19. Call ahead before visiting your doctor Call ahead. Many medical visits for routine care are being postponed or done by phone or telemedicine. If you have a medical appointment that cannot be postponed, call your doctor's office, and tell them you have or may have COVID-19. This will help the office protect themselves and other patients. If you are sick, wear a well-fitting mask You should wear a mask if you must be around other people or  animals, including pets (even at home). Wear a mask with the best fit, protection, and comfort for you. You don't need to wear the mask if you are alone. If you can't put on a mask (because of trouble breathing, for example), cover your coughs and sneezes in some other way. Try to stay at least 6 feet away from other people. This will help protect the people around you. Masks should not be placed on young  children under age 41 years, anyone who has trouble breathing, or anyone who is not able to remove the mask without help. Cover your coughs and sneezes Cover your mouth and nose with a tissue when you cough or sneeze. Throw away used tissues in a lined trash can. Immediately wash your hands with soap and water for at least 20 seconds. If soap and water are not available, clean your hands with an alcohol-based hand sanitizer that contains at least 60% alcohol. Clean your hands often Wash your hands often with soap and water for at least 20 seconds. This is especially important after blowing your nose, coughing, or sneezing; going to the bathroom; and before eating or preparing food. Use hand sanitizer if soap and water are not available. Use an alcohol-based hand sanitizer with at least 60% alcohol, covering all surfaces of your hands and rubbing them together until they feel dry. Soap and water are the best option, especially if hands are visibly dirty. Avoid touching your eyes, nose, and mouth with unwashed hands. Handwashing Tips Avoid sharing personal household items Do not share dishes, drinking glasses, cups, eating utensils, towels, or bedding with other people in your home. Wash these items thoroughly after using them with soap and water or put in the dishwasher. Clean surfaces in your home regularly Clean and disinfect high-touch surfaces (for example, doorknobs, tables, handles, light switches, and countertops) in your "sick room" and bathroom. In shared spaces, you should clean and  disinfect surfaces and items after each use by the person who is ill. If you are sick and cannot clean, a caregiver or other person should only clean and disinfect the area around you (such as your bedroom and bathroom) on an as needed basis. Your caregiver/other person should wait as long as possible (at least several hours) and wear a mask before entering, cleaning, and disinfecting shared spaces that you use. Clean and disinfect areas that may have blood, stool, or body fluids on them. Use household cleaners and disinfectants. Clean visible dirty surfaces with household cleaners containing soap or detergent. Then, use a household disinfectant. Use a product from [employer] List N: Disinfectants for Coronavirus (COVID-19). Be sure to follow the instructions on the label to ensure safe and effective use of the product. Many products recommend keeping the surface wet with a disinfectant for a certain period of time (look at "contact time" on the product label). You may also need to wear personal protective equipment, such as gloves, depending on the directions on the product label. Immediately after disinfecting, wash your hands with soap and water for 20 seconds. For completed guidance on cleaning and disinfecting your home, visit Complete Disinfection Guidance. Take steps to improve ventilation at home Improve ventilation (air flow) at home to help prevent from spreading COVID-19 to other people in your household. Clear out COVID-19 virus particles in the air by opening windows, using air filters, and turning on fans in your home. Use this interactive tool to learn how to improve air flow in your home. When you can be around others after being sick with COVID-19 Deciding when you can be around others is different for different situations. Find out when you can safely end home isolation. For any additional questions about your care, contact your healthcare provider or state or local health  department. 03/06/2021 Content source: [facility] Immunization and Respiratory Diseases ([facility]), Division of Viral Diseases This information is not intended to replace advice given to you by your health care provider.  Make sure you discuss any questions you have with your health care provider. Document Revised: 04/19/2021 Document Reviewed: 04/19/2021 Elsevier Patient Education  2022 [employer].     If you have been instructed to have an in-person evaluation today at a local Urgent Care facility, please use the link below. It will take you to a list of all of our available [facility], including address, phone number and hours of operation. Please do not delay care.  [facility]  If you or a family member do not have a primary care provider, use the link below to schedule a visit and establish care. When you choose a [facility] primary care physician or advanced practice provider, you gain a long-term partner in health. Find a Primary Care Provider  Learn more about [facility]'s in-office and virtual care options: [facility] - Get Care Now

## 2024-05-19 NOTE — Progress Notes (Signed)
 Virtual Visit Consent   Maria Clark, you are scheduled for a virtual visit with a [facility] provider today. Just as with appointments in the office, your consent must be obtained to participate. Your consent will be active for this visit and any virtual visit you may have with one of our providers in the next 365 days. If you have a MyChart account, a copy of this consent can be sent to you electronically.  As this is a virtual visit, video technology does not allow for your provider to perform a traditional examination. This may limit your provider's ability to fully assess your condition. If your provider identifies any concerns that need to be evaluated in person or the need to arrange testing (such as labs, EKG, etc.), we will make arrangements to do so. Although advances in technology are sophisticated, we cannot ensure that it will always work on either your end or our end. If the connection with a video visit is poor, the visit may have to be switched to a telephone visit. With either a video or telephone visit, we are not always able to ensure that we have a secure connection.  By engaging in this virtual visit, you consent to the provision of healthcare and authorize for your insurance to be billed (if applicable) for the services provided during this visit. Depending on your insurance coverage, you may receive a charge related to this service.  I need to obtain your verbal consent now. Are you willing to proceed with your visit today? Mechell Peterson Brandt Mcatee has provided verbal consent on 05/19/2024 for a virtual visit (video or telephone). Angelia Kelp, PA-C  Date: 05/19/2024 11:42 AM   Virtual Visit via Video Note   I, Angelia Kelp, connected with  Maria Clark  ([id number], Jun 26, 1971) on 05/19/24 at 11:30 AM EDT by a video-enabled telemedicine application and verified that I am speaking with the correct person using two  identifiers.  Location: Patient: Virtual Visit Location Patient: Home Provider: Virtual Visit Location Provider: Home Office   I discussed the limitations of evaluation and management by telemedicine and the availability of in person appointments. The patient expressed understanding and agreed to proceed.    History of Present Illness: Maria Clark is a 53 y.o. who identifies as a female who was assigned female at birth, and is being seen today for Covid 3.  HPI: URI  This is a new problem. The current episode started yesterday (Tested positive for Covid 19 this morning, Symptoms started last night). The problem has been gradually worsening. Maximum temperature: 99. The fever has been present for Less than 1 day. Associated symptoms include congestion (head feels like its in a vice), headaches, rhinorrhea (and post nasal drainage), sinus pain and a sore throat. Pertinent negatives include no coughing, diarrhea, ear pain, nausea, plugged ear sensation, vomiting or wheezing. Associated symptoms comments: Chills, body aches, shivering, fatigue. She has tried acetaminophen  for the symptoms. The treatment provided no relief.  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   Anemia 12/12/2021   Angioma 12/12/2021   Weight gain 10/24/2021   Hyperglycemia 10/24/2021   Hyperlipidemia 06/26/2021   Chronic fatigue 06/26/2021   Morbid  obesity (HCC) 12/06/2020   Chronic headaches 09/11/2020   Restless legs 09/11/2020   Asthma 09/11/2020   Essential hypertension 09/11/2020   OSA treated with BiPAP 09/11/2020   Endometriosis 06/28/2020   Nephrolithiasis 06/28/2020   Irritable bowel syndrome 07/02/2004    Allergies:  Allergies  Allergen Reactions   Dust Mite Extract Other (See Comments) and Swelling    Client reports "dust makes my asthma worse" it can increase wheezing..  Other Reaction(s): Bronchospasm   Medications:  Current Outpatient Medications:    nirmatrelvir/ritonavir (PAXLOVID) 20 x 150 MG & 10 x 100MG  TABS, Take 3 tablets by mouth 2 (two) times daily for 5 days. (Take nirmatrelvir 150 mg two tablets twice daily for 5 days and ritonavir 100 mg one tablet twice daily for 5 days) Patient GFR is greater than 60, Disp: 30 tablet, Rfl: 0   albuterol  (PROVENTIL ) (2.5 MG/3ML) 0.083% nebulizer solution, Take 3 mLs (2.5 mg total) by nebulization every 6 (six) hours as needed for wheezing or shortness of breath., Disp: 75 mL, Rfl: 0   albuterol  (VENTOLIN  HFA) 108 (90 Base) MCG/ACT inhaler, Inhale 2 puffs into the lungs every 6 (six) hours as needed for wheezing or shortness of breath., Disp: 1 each, Rfl: 1   ALPRAZolam (XANAX) 0.5 MG tablet, Take 0.5 mg by mouth daily as needed., Disp: , Rfl:    b complex vitamins capsule, Take 1 capsule by mouth daily., Disp: , Rfl:    buPROPion  (WELLBUTRIN  XL) 150 MG 24 hr tablet, TAKE 2 TABLETS (300 MG TOTAL) BY MOUTH DAILY., Disp: 180 tablet, Rfl: 3   cariprazine (VRAYLAR) 1.5 MG capsule, Take 1.5 mg by mouth daily., Disp: , Rfl:    colesevelam (WELCHOL) 625 MG tablet, Take 1,250 mg by mouth 2 (two) times daily with a meal., Disp: , Rfl:    cyclobenzaprine  (FLEXERIL ) 10 MG tablet, Take 1 tablet (10 mg total) by mouth 3 (three) times daily as needed for muscle spasms., Disp: 30 tablet, Rfl: 2   escitalopram (LEXAPRO) 20 MG tablet, Take 1 tablet by mouth daily., Disp: , Rfl:     hydrOXYzine (ATARAX) 50 MG tablet, Take 50-100 mg by mouth at bedtime., Disp: , Rfl:    ibuprofen (ADVIL) 200 MG tablet, Take 400 mg by mouth every 6 (six) hours as needed for headache or moderate pain., Disp: , Rfl:    ipratropium (ATROVENT ) 0.03 % nasal spray, PLACE 2 SPRAYS INTO BOTH NOSTRILS EVERY 12 (TWELVE) HOURS., Disp: 30 mL, Rfl: 4   lansoprazole  (PREVACID ) 30 MG capsule, Take 1 capsule (30 mg total) by mouth 2 (two) times daily before a meal., Disp: 120 capsule, Rfl: 2   phentermine  30 MG capsule, Take 1 capsule (30 mg total) by mouth every morning., Disp: 30 capsule, Rfl: 0   rOPINIRole  (REQUIP ) 1 MG tablet, Take 1 mg by mouth at bedtime., Disp: , Rfl:    Solriamfetol HCl (SUNOSI) 150 MG TABS, Take 150 mg by mouth daily., Disp: , Rfl:    tirzepatide  (ZEPBOUND ) 2.5 MG/0.5ML Pen, Inject 2.5 mg into the skin once a week., Disp: 2 mL, Rfl: 1   WIXELA INHUB 250-50 MCG/ACT AEPB, INHALE 1 PUFF INTO THE LUNGS IN THE MORNING AND AT BEDTIME., Disp: 60 each, Rfl:  1  Observations/Objective: Patient is well-developed, well-nourished in no acute distress.  Resting comfortably at home.  Head is normocephalic, atraumatic.  No labored breathing.  Speech is clear and coherent with logical content.  Patient is alert and oriented at baseline.    Assessment and Plan: 1. COVID-19 (Primary) - nirmatrelvir/ritonavir (PAXLOVID) 20 x 150 MG & 10 x 100MG  TABS; Take 3 tablets by mouth 2 (two) times daily for 5 days. (Take nirmatrelvir 150 mg two tablets twice daily for 5 days and ritonavir 100 mg one tablet twice daily for 5 days) Patient GFR is greater than 60  Dispense: 30 tablet; Refill: 0  - Continue OTC symptomatic management of choice - Will send OTC vitamins and supplement information through AVS - Paxlovid prescribed - Patient enrolled in MyChart symptom monitoring - Push fluids - Rest as needed - Discussed return precautions and when to seek in-person evaluation, sent via AVS as well   Follow  Up Instructions: I discussed the assessment and treatment plan with the patient. The patient was provided an opportunity to ask questions and all were answered. The patient agreed with the plan and demonstrated an understanding of the instructions.  A copy of instructions were sent to the patient via MyChart unless otherwise noted below.    The patient was advised to call back or seek an in-person evaluation if the symptoms worsen or if the condition fails to improve as anticipated.    Angelia Kelp, PA-C

## 2024-05-24 ENCOUNTER — Encounter: Payer: Self-pay | Admitting: Physician Assistant

## 2024-06-16 ENCOUNTER — Encounter (INDEPENDENT_AMBULATORY_CARE_PROVIDER_SITE_OTHER): Payer: Self-pay | Admitting: Nurse Practitioner

## 2024-06-16 ENCOUNTER — Telehealth: Payer: Self-pay

## 2024-06-16 DIAGNOSIS — R5382 Chronic fatigue, unspecified: Secondary | ICD-10-CM

## 2024-06-16 DIAGNOSIS — Z8619 Personal history of other infectious and parasitic diseases: Secondary | ICD-10-CM

## 2024-06-16 NOTE — Telephone Encounter (Signed)

## 2024-06-16 NOTE — Telephone Encounter (Signed)
 Called to set up appt for labs. No answer; unable to leave VM.

## 2024-06-22 ENCOUNTER — Other Ambulatory Visit (INDEPENDENT_AMBULATORY_CARE_PROVIDER_SITE_OTHER)

## 2024-06-22 DIAGNOSIS — R5382 Chronic fatigue, unspecified: Secondary | ICD-10-CM

## 2024-06-22 DIAGNOSIS — Z8619 Personal history of other infectious and parasitic diseases: Secondary | ICD-10-CM

## 2024-06-23 ENCOUNTER — Ambulatory Visit: Payer: Self-pay | Admitting: Nurse Practitioner

## 2024-06-23 DIAGNOSIS — Z8619 Personal history of other infectious and parasitic diseases: Secondary | ICD-10-CM

## 2024-06-24 ENCOUNTER — Encounter: Payer: Self-pay | Admitting: Nurse Practitioner

## 2024-06-27 LAB — EPSTEIN-BARR VIRUS VCA ANTIBODY PANEL
EBV NA IgG: >600 U/mL — ABNORMAL HIGH
EBV VCA IgG: 218 U/mL — ABNORMAL HIGH
EBV VCA IgM: <36 U/mL

## 2024-06-27 LAB — TEST AUTHORIZATION

## 2024-06-27 LAB — EPSTEIN-BARR VIRUS EARLY D ANTIGEN ANTIBODY, IGG: EBV EA IgG: 15.4 U/mL — ABNORMAL HIGH (ref <9.00)

## 2024-07-22 ENCOUNTER — Other Ambulatory Visit: Payer: Self-pay | Admitting: Obstetrics & Gynecology

## 2024-07-22 DIAGNOSIS — Z1231 Encounter for screening mammogram for malignant neoplasm of breast: Secondary | ICD-10-CM

## 2024-08-09 ENCOUNTER — Telehealth: Payer: Self-pay

## 2024-08-09 MED ORDER — FLUTICASONE-SALMETEROL 250-50 MCG/ACT IN AEPB: 1.0000 | INHALATION_SPRAY | Freq: Two times a day (BID) | RESPIRATORY_TRACT | 1 refills | Status: DC

## 2024-08-09 NOTE — Telephone Encounter (Signed)
 Requesting: Fluticasone -Salmetrol 250-50 Last Visit: 03/26/2024 Next Visit: Visit date not found Last Refill: 2/624 and dc'd 07/08/2023  Please Advise

## 2024-09-21 ENCOUNTER — Ambulatory Visit
Admission: RE | Admit: 2024-09-21 | Discharge: 2024-09-21 | Disposition: A | Source: Ambulatory Visit | Attending: Obstetrics & Gynecology | Admitting: Obstetrics & Gynecology

## 2024-09-21 DIAGNOSIS — Z1231 Encounter for screening mammogram for malignant neoplasm of breast: Secondary | ICD-10-CM

## 2024-09-24 ENCOUNTER — Other Ambulatory Visit: Payer: Self-pay | Admitting: Obstetrics & Gynecology

## 2024-09-24 DIAGNOSIS — R928 Other abnormal and inconclusive findings on diagnostic imaging of breast: Secondary | ICD-10-CM

## 2024-09-25 ENCOUNTER — Other Ambulatory Visit: Payer: Self-pay | Admitting: Nurse Practitioner

## 2024-09-27 NOTE — Telephone Encounter (Signed)
 Requesting: fluticasone  250 mcg-salmeterol 50 mcg/dose blistr powdr for inhalation  Last Visit: 03/26/2024 Next Visit: Visit date not found Last Refill: 08/09/2024  Please Advise

## 2024-09-30 ENCOUNTER — Ambulatory Visit
Admission: RE | Admit: 2024-09-30 | Discharge: 2024-09-30 | Disposition: A | Discharge status: Home / Self Care | Source: Ambulatory Visit | Attending: Obstetrics & Gynecology | Admitting: Obstetrics & Gynecology

## 2024-09-30 DIAGNOSIS — R928 Other abnormal and inconclusive findings on diagnostic imaging of breast: Secondary | ICD-10-CM

## 2024-12-01 ENCOUNTER — Ambulatory Visit: Admitting: Nurse Practitioner

## 2024-12-01 VITALS — BP 116/88 | HR 89 | Temp 97.0°F | Ht 66.0 in | Wt 235.4 lb

## 2024-12-01 DIAGNOSIS — R197 Diarrhea, unspecified: Secondary | ICD-10-CM | POA: Diagnosis not present

## 2024-12-01 MED ORDER — AMOXICILLIN-POT CLAVULANATE 875-125 MG PO TABS: 1.0000 | ORAL_TABLET | Freq: Two times a day (BID) | ORAL | 0 refills | Status: AC

## 2024-12-01 MED ORDER — FLUCONAZOLE 150 MG PO TABS: ORAL_TABLET | ORAL | 0 refills | Status: AC

## 2024-12-01 NOTE — Patient Instructions (Signed)
 It was great to see you!  Start augmentin  twice a day for 7 days   Take diflucan  1 tablet after finishing antibiotic then 3 days later  Sip on fluids, water, gatorade pedialyte   If you are hungry, choose bland foods crackers, toast, rice, applesauce   Let's follow-up if your symptoms worsen or don't improve   Take care,  Tinnie Harada, NP

## 2024-12-01 NOTE — Assessment & Plan Note (Signed)
 Suspected diverticulitis flare vs gastroenteritis due to acute diarrhea and abdominal pain. Recent colonoscopy showed diverticulosis. Prescribed Augmentin  twice daily for 7 days. Start Diflucan  after antibiotics and again in three days if needed. Monitor for yeast infection symptoms and take Diflucan  as directed. Maintain hydration with fluids like water, Gatorade, or Pedialyte. Follow a bland diet with crackers, toast, rice, and applesauce if hungry. Seek emergency care if experiencing lightheadedness, dizziness, or inability to keep fluids down.

## 2024-12-01 NOTE — Progress Notes (Signed)
 Acute Office Visit  Subjective:     Patient ID: Maria Clark, female    DOB: 21-May-1971, 53 y.o.   MRN: [id number]  Chief Complaint  Patient presents with   Diarrhea    3 days of diarrhea, stomach pain    HPI Discussed the use of AI scribe software for clinical note transcription with the patient, who gave verbal consent to proceed.  History of Present Illness   Maria Clark is a 53 year old female with diverticulosis who presents with diarrhea and stomach pain.  She has had frequent, watery diarrhea and stomach pain since Monday afternoon, with about five episodes between 3 PM and 9 PM that day and ongoing frequent loose stools. She feels very gassy with burping and abdominal gurgling and describes her abdomen as very sore, like after doing many sit-ups. She has a constant runny nose but no fever, cough, or other respiratory symptoms. She has a headache she attributes to dehydration and feels very dehydrated. She has slight nausea but no vomiting, no blood in the stool, no recent travel, no sick contacts, and no recent antibiotic use.  She has been taking Imodium without relief and has had difficulty eating due to discomfort, with no food intake today. 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  She has diverticulosis and had polyps on a prior colonoscopy. A recent food sensitivity test noted reactions to bananas, cow's milk, and yeast.     ROS See pertinent positives and negatives per HPI.     Objective:    BP 116/88 (BP Location: Left Arm, Cuff Size: Normal)   Pulse 89   Temp (!) 97 F (36.1 C)   Ht 5' 6 (1.676 m)   Wt 235 lb 6.4 oz (106.8 kg)   SpO2 96%   BMI 37.99 kg/m    Physical Exam Vitals and nursing note reviewed.  Constitutional:      General: She is not in acute distress.    Appearance: Normal appearance.  HENT:     Head: Normocephalic.  Eyes:     Conjunctiva/sclera: Conjunctivae normal.  Cardiovascular:      Rate and Rhythm: Normal rate and regular rhythm.     Pulses: Normal pulses.     Heart sounds: Normal heart sounds.  Pulmonary:     Effort: Pulmonary effort is normal.     Breath sounds: Normal breath sounds.  Abdominal:     General: There is no distension.     Palpations: Abdomen is soft.     Tenderness: There is abdominal tenderness (LLQ).  Musculoskeletal:     Cervical back: Normal range of motion.  Skin:    General: Skin is warm.  Neurological:     General: No focal deficit present.     Mental Status: She is alert and oriented to person, place, and time.  Psychiatric:        Mood and Affect: Mood normal.        Behavior: Behavior normal.        Thought Content: Thought content normal.        Judgment: Judgment normal.       Assessment & Plan:   Problem List Items Addressed This Visit       Other   Diarrhea - Primary   Suspected diverticulitis flare vs gastroenteritis due to acute diarrhea and abdominal pain. Recent colonoscopy showed diverticulosis. Prescribed Augmentin  twice daily for 7 days. Start Diflucan  after antibiotics and again in three  days if needed. Monitor for yeast infection symptoms and take Diflucan  as directed. Maintain hydration with fluids like water, Gatorade, or Pedialyte. Follow a bland diet with crackers, toast, rice, and applesauce if hungry. Seek emergency care if experiencing lightheadedness, dizziness, or inability to keep fluids down.        Meds ordered this encounter  Medications   amoxicillin -clavulanate (AUGMENTIN ) 875-125 MG tablet    Sig: Take 1 tablet by mouth 2 (two) times daily.    Dispense:  14 tablet    Refill:  0   fluconazole  (DIFLUCAN ) 150 MG tablet    Sig: Take 1 tablet after finishing antibiotic, then a second tablet 3 days later if needed    Dispense:  2 tablet    Refill:  0    Return if symptoms worsen or fail to improve.  Tinnie DELENA Harada, NP

## 2024-12-18 ENCOUNTER — Encounter: Payer: Self-pay | Admitting: Nurse Practitioner

## 2024-12-22 NOTE — Progress Notes (Signed)
" ° °  12/22/2024  Patient ID: Maria Clark, female   DOB: 09/28/1971, 54 y.o.   MRN: [id number]  PCP routed patient inquiry regarding efficacy of luteolin in patient's with Elbert Shine Virus as well as safety with current medications.  Upon review, luteolin has only been studied in vitro, not invivo; so efficacy in regard to EBV is unknown in humans.  No DDI's found with patient's current medication list, so this should be safe for her to take.  This is naturally occurring in certain foods (fruits and vegetables like broccoli, onion, parsley, green peppers, citrus, celery and chamomile), so I would recommend increased intake of these versus paying for supplmentation.  Channing DELENA Mealing, PharmD, DPLA  "
